# Patient Record
Sex: Female | Born: 1935 | Race: White | Hispanic: No | Marital: Married | Smoking: Never smoker
Health system: Southern US, Community
[De-identification: ages and names within clinical notes are randomized; demographics above are authoritative.]

## PROBLEM LIST (undated history)

## (undated) ENCOUNTER — Emergency Department (HOSPITAL_BASED_OUTPATIENT_CLINIC_OR_DEPARTMENT_OTHER): Payer: Medicare Other

## (undated) DIAGNOSIS — F40240 Claustrophobia: Secondary | ICD-10-CM

## (undated) DIAGNOSIS — R112 Nausea with vomiting, unspecified: Secondary | ICD-10-CM

## (undated) DIAGNOSIS — M1990 Unspecified osteoarthritis, unspecified site: Secondary | ICD-10-CM

## (undated) DIAGNOSIS — R918 Other nonspecific abnormal finding of lung field: Secondary | ICD-10-CM

## (undated) DIAGNOSIS — I499 Cardiac arrhythmia, unspecified: Secondary | ICD-10-CM

## (undated) DIAGNOSIS — N189 Chronic kidney disease, unspecified: Secondary | ICD-10-CM

## (undated) DIAGNOSIS — J8489 Other specified interstitial pulmonary diseases: Secondary | ICD-10-CM

## (undated) DIAGNOSIS — J40 Bronchitis, not specified as acute or chronic: Secondary | ICD-10-CM

## (undated) DIAGNOSIS — I10 Essential (primary) hypertension: Secondary | ICD-10-CM

## (undated) DIAGNOSIS — Z9889 Other specified postprocedural states: Secondary | ICD-10-CM

## (undated) HISTORY — DX: Other specified interstitial pulmonary diseases: J84.89

## (undated) HISTORY — PX: OTHER SURGICAL HISTORY: SHX169

## (undated) HISTORY — PX: TONSILLECTOMY: SUR1361

## (undated) HISTORY — DX: Essential (primary) hypertension: I10

## (undated) HISTORY — PX: CHOLECYSTECTOMY: SHX55

## (undated) HISTORY — PX: HIP SURGERY: SHX245

## (undated) HISTORY — PX: APPENDECTOMY: SHX54

## (undated) HISTORY — DX: Other nonspecific abnormal finding of lung field: R91.8

---

## 1998-04-11 ENCOUNTER — Emergency Department (HOSPITAL_COMMUNITY): Admission: EM | Admit: 1998-04-11 | Discharge: 1998-04-11 | Payer: Self-pay | Admitting: Emergency Medicine

## 1998-04-11 ENCOUNTER — Encounter: Payer: Self-pay | Admitting: Emergency Medicine

## 1998-10-27 ENCOUNTER — Other Ambulatory Visit: Admission: RE | Admit: 1998-10-27 | Discharge: 1998-10-27 | Payer: Self-pay | Admitting: Family Medicine

## 2000-10-30 ENCOUNTER — Other Ambulatory Visit: Admission: RE | Admit: 2000-10-30 | Discharge: 2000-10-30 | Payer: Self-pay | Admitting: Family Medicine

## 2000-11-06 ENCOUNTER — Encounter: Admission: RE | Admit: 2000-11-06 | Discharge: 2000-11-06 | Payer: Self-pay | Admitting: Family Medicine

## 2000-11-06 ENCOUNTER — Encounter: Payer: Self-pay | Admitting: Family Medicine

## 2003-10-09 ENCOUNTER — Encounter: Admission: RE | Admit: 2003-10-09 | Discharge: 2003-10-09 | Payer: Self-pay | Admitting: Specialist

## 2004-07-12 ENCOUNTER — Ambulatory Visit: Payer: Self-pay | Admitting: Critical Care Medicine

## 2004-09-13 ENCOUNTER — Ambulatory Visit: Payer: Self-pay | Admitting: Critical Care Medicine

## 2005-01-12 ENCOUNTER — Encounter: Admission: RE | Admit: 2005-01-12 | Discharge: 2005-01-12 | Payer: Self-pay

## 2005-01-18 ENCOUNTER — Ambulatory Visit: Payer: Self-pay | Admitting: Critical Care Medicine

## 2005-01-23 ENCOUNTER — Ambulatory Visit: Admission: RE | Admit: 2005-01-23 | Discharge: 2005-01-23 | Payer: Self-pay | Admitting: Critical Care Medicine

## 2005-01-23 ENCOUNTER — Ambulatory Visit: Payer: Self-pay | Admitting: Critical Care Medicine

## 2005-01-23 ENCOUNTER — Encounter (INDEPENDENT_AMBULATORY_CARE_PROVIDER_SITE_OTHER): Payer: Self-pay | Admitting: *Deleted

## 2005-02-01 ENCOUNTER — Ambulatory Visit: Payer: Self-pay | Admitting: Critical Care Medicine

## 2005-02-22 ENCOUNTER — Ambulatory Visit: Payer: Self-pay | Admitting: Critical Care Medicine

## 2005-03-06 ENCOUNTER — Ambulatory Visit: Payer: Self-pay | Admitting: Cardiology

## 2005-03-23 ENCOUNTER — Ambulatory Visit: Payer: Self-pay | Admitting: Pulmonary Disease

## 2005-04-05 ENCOUNTER — Ambulatory Visit: Payer: Self-pay | Admitting: Critical Care Medicine

## 2005-04-11 ENCOUNTER — Ambulatory Visit: Payer: Self-pay | Admitting: Cardiology

## 2005-05-01 ENCOUNTER — Ambulatory Visit: Payer: Self-pay | Admitting: Critical Care Medicine

## 2005-07-26 ENCOUNTER — Ambulatory Visit: Payer: Self-pay | Admitting: Internal Medicine

## 2005-10-22 ENCOUNTER — Ambulatory Visit: Payer: Self-pay | Admitting: Pulmonary Disease

## 2005-11-20 ENCOUNTER — Ambulatory Visit: Payer: Self-pay | Admitting: Critical Care Medicine

## 2005-11-23 ENCOUNTER — Ambulatory Visit: Payer: Self-pay | Admitting: Cardiology

## 2005-11-27 ENCOUNTER — Ambulatory Visit: Admission: RE | Admit: 2005-11-27 | Discharge: 2005-11-27 | Payer: Self-pay | Admitting: Critical Care Medicine

## 2005-12-12 ENCOUNTER — Ambulatory Visit: Payer: Self-pay | Admitting: Critical Care Medicine

## 2006-01-23 ENCOUNTER — Ambulatory Visit: Payer: Self-pay | Admitting: Critical Care Medicine

## 2006-01-25 ENCOUNTER — Ambulatory Visit: Payer: Self-pay | Admitting: Cardiology

## 2006-03-20 ENCOUNTER — Ambulatory Visit: Payer: Self-pay | Admitting: Critical Care Medicine

## 2006-05-20 ENCOUNTER — Ambulatory Visit: Payer: Self-pay | Admitting: Critical Care Medicine

## 2006-07-01 ENCOUNTER — Ambulatory Visit: Payer: Self-pay | Admitting: Critical Care Medicine

## 2006-08-08 ENCOUNTER — Ambulatory Visit: Payer: Self-pay | Admitting: Critical Care Medicine

## 2006-10-21 ENCOUNTER — Ambulatory Visit: Payer: Self-pay | Admitting: Pulmonary Disease

## 2006-11-13 ENCOUNTER — Ambulatory Visit: Payer: Self-pay | Admitting: Critical Care Medicine

## 2006-11-28 ENCOUNTER — Encounter: Admission: RE | Admit: 2006-11-28 | Discharge: 2006-11-28 | Payer: Self-pay | Admitting: Family Medicine

## 2006-12-13 ENCOUNTER — Encounter: Admission: RE | Admit: 2006-12-13 | Discharge: 2006-12-13 | Payer: Self-pay | Admitting: Gastroenterology

## 2007-01-07 DIAGNOSIS — K219 Gastro-esophageal reflux disease without esophagitis: Secondary | ICD-10-CM

## 2007-01-07 DIAGNOSIS — J8410 Pulmonary fibrosis, unspecified: Secondary | ICD-10-CM | POA: Insufficient documentation

## 2007-03-06 ENCOUNTER — Ambulatory Visit: Payer: Self-pay | Admitting: Critical Care Medicine

## 2007-03-06 DIAGNOSIS — I10 Essential (primary) hypertension: Secondary | ICD-10-CM | POA: Insufficient documentation

## 2007-04-04 ENCOUNTER — Inpatient Hospital Stay (HOSPITAL_COMMUNITY): Admission: EM | Admit: 2007-04-04 | Discharge: 2007-04-08 | Payer: Self-pay | Admitting: Emergency Medicine

## 2007-04-04 ENCOUNTER — Ambulatory Visit: Payer: Self-pay | Admitting: Physical Medicine & Rehabilitation

## 2007-04-04 ENCOUNTER — Encounter (INDEPENDENT_AMBULATORY_CARE_PROVIDER_SITE_OTHER): Payer: Self-pay | Admitting: Specialist

## 2007-04-04 ENCOUNTER — Ambulatory Visit: Payer: Self-pay | Admitting: Critical Care Medicine

## 2007-04-08 ENCOUNTER — Inpatient Hospital Stay (HOSPITAL_COMMUNITY)
Admission: RE | Admit: 2007-04-08 | Discharge: 2007-04-11 | Payer: Self-pay | Admitting: Physical Medicine & Rehabilitation

## 2008-07-22 ENCOUNTER — Ambulatory Visit: Payer: Self-pay | Admitting: Critical Care Medicine

## 2008-12-09 ENCOUNTER — Ambulatory Visit: Payer: Self-pay | Admitting: Critical Care Medicine

## 2009-03-29 ENCOUNTER — Telehealth (INDEPENDENT_AMBULATORY_CARE_PROVIDER_SITE_OTHER): Payer: Self-pay | Admitting: *Deleted

## 2009-03-31 ENCOUNTER — Ambulatory Visit: Payer: Self-pay | Admitting: Diagnostic Radiology

## 2009-03-31 ENCOUNTER — Ambulatory Visit: Payer: Self-pay | Admitting: Critical Care Medicine

## 2009-03-31 ENCOUNTER — Ambulatory Visit (HOSPITAL_BASED_OUTPATIENT_CLINIC_OR_DEPARTMENT_OTHER): Admission: RE | Admit: 2009-03-31 | Discharge: 2009-03-31 | Payer: Self-pay | Admitting: Critical Care Medicine

## 2009-04-15 ENCOUNTER — Encounter: Payer: Self-pay | Admitting: Critical Care Medicine

## 2009-04-21 ENCOUNTER — Ambulatory Visit: Payer: Self-pay | Admitting: Diagnostic Radiology

## 2009-04-21 ENCOUNTER — Ambulatory Visit: Payer: Self-pay | Admitting: Critical Care Medicine

## 2009-04-21 ENCOUNTER — Ambulatory Visit (HOSPITAL_BASED_OUTPATIENT_CLINIC_OR_DEPARTMENT_OTHER): Admission: RE | Admit: 2009-04-21 | Discharge: 2009-04-21 | Payer: Self-pay | Admitting: Family Medicine

## 2010-04-27 NOTE — Progress Notes (Signed)
Summary: PNUEMONIA  Phone Note Call from Patient Call back at [phone]   Caller: Patient Call For: WRIGHT Summary of Call: THICK CONGESTION IN THROAT COUGH NO FEVER PT STATES DR Delford Field WANT TO SEE HER IF THESE SYMPTOMS OCCUR Initial call taken by: Rickard Patience,  March 29, 2009 1:41 PM  Follow-up for Phone Call        called and spoke with pt.  pt was most recently seen by TP 12-09-2008.  Pt last saw PW 07-22-2008.  Pt states she has "residual pna in the lining of her lungs" and is coughing up white sputum and pain across shoulder blades and back x 3 to 4 days. Pt denied increased sob or fever.  Offered pt an appt with TP or another provider in the office.  Pt declined stating she only wanted to see PW.  PW's only day here  the rest of the week is Thurs and that's at [employer].  Schedule is full.  Spoke with TD regarding what to offer pt.  Per TD,  again offer an appt with any provider or she can go to urgent care or ED.  Called and spoke with pt.  Informed her again that PW has no opening but would gladly schedule her an appt with another provider in the office or suggested she can go to ER or urgent care.  Pt again declined appt with another provider and instead wanted to schedule appt with PW for next avail.  PT scheduled to see PW 04/07/2009 at 10:57m.  Pt was instructed to go to ER if she felt her symptoms needed immedate attention.  Pt verbalized understanding.  Aundra Millet Reynolds LPN  March 29, 2009 3:32 PM    ** Will still forward message to PW so he is aware.      Appended Document: PNUEMONIA I only see 12 pts on thursday [facility] schedule  pls offer that to the pt   pw  Appended Document: PNUEMONIA Patient is scheduled to see PW in the [facility] office on 03/31/09 @ 10:50pm and was given directions and the phone number.  Appended Document: PNUEMONIA good  pw

## 2010-04-27 NOTE — Miscellaneous (Signed)
Summary: Orders Update   Clinical Lists Changes  Orders: Added new Service order of Est. Patient Level V (99215) - Signed 

## 2010-04-27 NOTE — Assessment & Plan Note (Signed)
Summary: Pulmonary OV   CC:  Acute Visit with CXR.  having aching back and shoulder pain that is radiatig to neck, throat congestion with thick, clear mucus, and and dry cough x1wk.  Marland Kitchen  History of Present Illness: 77 yo female  with known history of  BOOP/COP.    July 22, 2008-  last ov 03/06/07 Last seen in hosp with jan 09  hip fx. Pt doing well since.  No new resp complaints.  No cough.  No mucous.  No chest pain.  No wheeze.    December 09, 2008--Presents for an acute office visit .Complains of  cough-dry x 1 wk.,  hoarseness and fagtigue . Denies chest pain, dyspnea, orthopnea, hemoptysis, fever, n/v/d, edema, headache. Drippy nose, sore throat. No discolored mucus.   March 31, 2009 11:34 AM The pt saw NP in sept 10. There is  some congestoin and rx abx and this cleared. now: was ok until the past week pain back and neck and shoulders.  Pain across the back. saw PCP: BP was high, rx muscle relaxant not taken notes more cough prod of clear thick and is worse,  no real fever,  some pndrip,  not as dyspneic as before.  has to concentrate on deep breathing.  Preventive Screening-Counseling & Management  Alcohol-Tobacco     Smoking Status: never  Current Medications (verified): 1)  Benicar 5 Mg Tabs (Olmesartan Medoxomil) .... Take 1 Tablet By Mouth Once A Day 2)  Ativan 0.5 Mg  Tabs (Lorazepam) .... At Bedtime 3)  Fluticasone Propionate 50 Mcg/act Susp (Fluticasone Propionate) .Marland Kitchen.. 1 Spray Each Nostril As Needed 4)  Calcium Citrate With Vitamin D .... 1/2 Tab Once Daily  Allergies (verified): 1)  ! Codeine  Past History:  Past medical, surgical, family and social histories (including risk factors) reviewed, and no changes noted (except as noted below).  Past Medical History: Reviewed history from 03/06/2007 and no changes required. 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  Social History: Reviewed  history and no changes required.  Review of Systems       The patient complains of shortness of breath with activity.  The patient denies shortness of breath at rest, productive cough, non-productive cough, coughing up blood, chest pain, irregular heartbeats, acid heartburn, indigestion, loss of appetite, weight change, abdominal pain, difficulty swallowing, sore throat, tooth/dental problems, headaches, nasal congestion/difficulty breathing through nose, sneezing, itching, ear ache, anxiety, depression, hand/feet swelling, joint stiffness or pain, rash, change in color of mucus, and fever.    Vital Signs:  Patient profile:   77 year old female Height:      64 inches Weight:      121 pounds BMI:     20.84 O2 Sat:      97 % on Room air Temp:     97.8 degrees F oral Pulse rate:   71 / minute BP sitting:   134 / 72  (left arm) Cuff size:   regular  Vitals Entered By: Gweneth Dimitri RN (March 31, 2009 11:28 AM)  O2 Flow:  Room air CC: Acute Visit with CXR.  having aching back and shoulder pain that is radiatig to neck, throat congestion with thick, clear mucus, and dry cough x1wk.   Comments Medications reviewed with patient Gweneth Dimitri RN  March 31, 2009 11:29 AM    Physical Exam  Additional Exam:  Gen: Pleasant, well nourished, in no distress ENT:  no lesions, pale mucosa Neck: No JVD, no TMG, no carotid bruits Lungs: no use of accessory muscles, no dullness to percussion, clear without rales or rhonchi Cardiovascular: RRR, heart sounds normal, no murmurs or gallops, no peripheral edema Musculoskeletal: No deformities, no cyanosis or clubbing  Skin no rash, clear.     CXR  Procedure date:  03/31/2009  Findings:      IMPRESSION:   1.  No acute cardiopulmonary process. 2.  Hyperinflation suggests emphysema. 3.  Potential new pulmonary nodule at the right lung base as described above.  Recommend noncontrast CT of the thorax.  CT of Chest  Procedure date:   03/31/2009  Findings:      IMPRESSION: The density in the right lower lung at radiography represents two adjacent nodules posteriorly in the right lower lobe.  I think these are highly likely to represent benign granulomas.  The more lateral nodule is the same size as was seen in September 2008 and shows progressive calcification.  The more medial nodule has enlarged from 3 mm to 5 mm, is well circumscribed and shows similar progressive calcification.  Given this appearance, I do not think further follow-up would be necessary.  CT of Chest  Procedure date:  03/31/2009  Findings:      Findings: There is no pleural or pericardial fluid.  There is no mediastinal or hilar mass or adenopathy.  The left lung is clear except for mild scarring at the base.  The right lung shows mild chronic scarring in the middle lobe.  There are two adjacent nodules posteriorly in the right lower lobe on image 43.  The more lateral nodule is unchanged from the previous examination in size, measuring 5 x 8 mm.  This shows progressive calcification.  Just medial to that, there is a well circumscribed nodule that previously measured 3 mm and presently measures 5 mm.  This shows similar progressive calcification.  I think it is highly likely that this constellation of findings indicates benign granulomas.   Scans in the upper abdomen do not show any worrisome finding.   IMPRESSION: The density in the right lower lung at radiography represents two adjacent nodules posteriorly in the right lower lobe.  I think these are highly likely to represent benign granulomas.  The more lateral nodule is the same size as was seen in September 2008 and shows progressive calcification.  The more medial nodule has enlarged from 3 mm to 5 mm, is well circumscribed and shows similar progressive calcification.  Given this appearance, I do not think further follow-up would be necessary.    Impression &  Recommendations:  Problem # 1:  PULMONARY FIBROSIS, POSTINFLAMMATORY (ICD-515) Assessment Deteriorated  Stable pulmonary fibrosis isolated to LLL,  RLL nodular changes consistent with granulomas on CT chest,  hx of BOOP now with mild flare  rec: pulse prednisone 40mg >>>0 over 8days avelox x 5 days  Medications Added to Medication List This Visit: 1)  Ativan 0.5 Mg Tabs (Lorazepam) .... At bedtime 2)  Fluticasone Propionate 50 Mcg/act Susp (Fluticasone propionate) .Marland Kitchen.. 1 spray each nostril as needed 3)  Calcium Citrate With Vitamin D  .... 1/2 tab once daily 4)  Prednisone 10 Mg Tabs (Prednisone) .... Take as directed 4 each am x3days, 3 x 3days, 2 x 3days, 1 x 3days then stop 5)  Avelox 400 Mg Tabs (Moxifloxacin hcl) .... By mouth daily  Complete Medication List: 1)  Benicar 5 Mg Tabs (Olmesartan medoxomil) .... Take 1 tablet by mouth once  a day 2)  Ativan 0.5 Mg Tabs (Lorazepam) .... At bedtime 3)  Fluticasone Propionate 50 Mcg/act Susp (Fluticasone propionate) .Marland Kitchen.. 1 spray each nostril as needed 4)  Calcium Citrate With Vitamin D  .... 1/2 tab once daily 5)  Prednisone 10 Mg Tabs (Prednisone) .... Take as directed 4 each am x3days, 3 x 3days, 2 x 3days, 1 x 3days then stop 6)  Avelox 400 Mg Tabs (Moxifloxacin hcl) .... By mouth daily  Other Orders: T-2 View CXR (71020TC) Radiology Referral (Radiology)  Patient Instructions: 1)  Avelox one daily x 5 days 2)  Prednisone 10mg  4 each am x3days, 3 x 3days, 2 x 3days, 1 x 3days then stop 3)  A CT chest will be obtained today , I will call with results 4)  Return two- three weeks [facility] office Prescriptions: AVELOX 400 MG  TABS (MOXIFLOXACIN HCL) By mouth daily Brand medically necessary #5 x 0   Entered and Authorized by:   Storm Frisk MD   Signed by:   Storm Frisk MD on 03/31/2009   Method used:   [facility] to        [facility]  [phone]* (retail)       [address] [place], [place]  [address]        Ph: [phone] or [phone]       Fax: [phone]   RxID:   [id number] PREDNISONE 10 MG  TABS (PREDNISONE) Take as directed 4 each am x3days, 3 x 3days, 2 x 3days, 1 x 3days then stop  #30 x 0   Entered and Authorized by:   Storm Frisk MD   Signed by:   Storm Frisk MD on 03/31/2009   Method used:   Electronically to        [facility]  [phone]* (retail)       [address] [place], [place]  [address]       Ph: [phone] or [phone]       Fax: [phone]   RxID:   [id number]

## 2010-04-27 NOTE — Assessment & Plan Note (Signed)
Summary: Pulmonary OV   CC:  3 wk follow up.  states breathing is doing well overall.  No complaints..  History of Present Illness: 77 yo female  with known history of  BOOP/COP.    July 22, 2008-  last ov 03/06/07 Last seen in hosp with jan 09  hip fx. Pt doing well since.  No new resp complaints.  No cough.  No mucous.  No chest pain.  No wheeze.    December 09, 2008--Presents for an acute office visit .Complains of  cough-dry x 1 wk.,  hoarseness and fagtigue . Denies chest pain, dyspnea, orthopnea, hemoptysis, fever, n/v/d, edema, headache. Drippy nose, sore throat. No discolored mucus.   March 31, 2009 11:34 AM The pt saw NP in sept 10. There is  some congestoin and rx abx and this cleared. now: was ok until the past week pain back and neck and shoulders.  Pain across the back. saw PCP: BP was high, rx muscle relaxant not taken notes more cough prod of clear thick and is worse,  no real fever,  some pndrip,  not as dyspneic as before.  has to concentrate on deep breathing.  April 21, 2009 10:16 AM Pain is gone and no congestion.  Not much pndrip.   Able to deep breath.  Sl cough and is minimal. No new issues.  CT Chest was ok.  Nodules in RLL are granulomas.  Preventive Screening-Counseling & Management  Alcohol-Tobacco     Smoking Status: quit > 6 months  Current Medications (verified): 1)  Benicar 5 Mg Tabs (Olmesartan Medoxomil) .... Take 1 Tablet By Mouth Once A Day 2)  Ativan 0.5 Mg  Tabs (Lorazepam) .... At Bedtime 3)  Fluticasone Propionate 50 Mcg/act Susp (Fluticasone Propionate) .Marland Kitchen.. 1 Spray Each Nostril As Needed 4)  Calcium Citrate With Vitamin D .... 1/2 Tab Once Daily 5)  Eql Childrens Multivitamins  Chew (Pediatric Multiple Vitamins) .... Once Daily  Allergies (verified): 1)  ! Codeine  Past History:  Past medical, surgical, family and social histories (including risk factors) reviewed, and no changes noted (except as noted below).  Past Medical  History: Reviewed history from 03/06/2007 and no changes required. 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  Social History: Reviewed history and no changes required. Smoking Status:  quit > 6 months  Review of Systems  The patient denies shortness of breath with activity, shortness of breath at rest, productive cough, non-productive cough, coughing up blood, chest pain, irregular heartbeats, acid heartburn, indigestion, loss of appetite, weight change, abdominal pain, difficulty swallowing, sore throat, tooth/dental problems, headaches, nasal congestion/difficulty breathing through nose, sneezing, itching, ear ache, anxiety, depression, hand/feet swelling, joint stiffness or pain, rash, change in color of mucus, and fever.    Vital Signs:  Patient profile:   77 year old female Height:      64 inches Weight:      120 pounds BMI:     20.67 O2 Sat:      97 % on Room air Temp:     97.9 degrees F oral Pulse rate:   76 / minute BP sitting:   134 / 90  (left arm) Cuff size:   regular  Vitals Entered By: Gweneth Dimitri RN (April 21, 2009 9:55 AM)  O2 Flow:  Room air CC: 3 wk follow up.  states breathing is doing well overall.  No complaints. Comments Medications reviewed with patient Daytime contact  number verified with patient. Gweneth Dimitri RN  April 21, 2009 9:56 AM    Physical Exam  Additional Exam:  Gen: Pleasant, well nourished, in no distress ENT: no lesions, pale mucosa Neck: No JVD, no TMG, no carotid bruits Lungs: no use of accessory muscles, no dullness to percussion, clear without rales or rhonchi Cardiovascular: RRR, heart sounds normal, no murmurs or gallops, no peripheral edema Musculoskeletal: No deformities, no cyanosis or clubbing  Skin no rash, clear.     Impression & Recommendations:  Problem # 1:  UPPER RESPIRATORY INFECTION (ICD-465.9) Assessment Improved URI  resolved  Orders: Est. Patient Level II (81191)  Problem # 2:  PULMONARY FIBROSIS, POSTINFLAMMATORY (ICD-515) Assessment: Improved No evidence of pulmonary fibrosis or boop recurrence on recent CT Chest Benign calcifiing granulomas seen.    plan no further CT Chest needed   Medications Added to Medication List This Visit: 1)  Eql Childrens Multivitamins Chew (Pediatric multiple vitamins) .... Once daily  Complete Medication List: 1)  Benicar 5 Mg Tabs (Olmesartan medoxomil) .... Take 1 tablet by mouth once a day 2)  Ativan 0.5 Mg Tabs (Lorazepam) .... At bedtime 3)  Fluticasone Propionate 50 Mcg/act Susp (Fluticasone propionate) .Marland Kitchen.. 1 spray each nostril as needed 4)  Calcium Citrate With Vitamin D  .... 1/2 tab once daily 5)  Eql Childrens Multivitamins Chew (Pediatric multiple vitamins) .... Once daily  Patient Instructions: 1)  no changes    2)  retrurn as needed   Immunization History:  Influenza Immunization History:    Influenza:  historical (12/24/2008)

## 2010-05-05 ENCOUNTER — Telehealth (INDEPENDENT_AMBULATORY_CARE_PROVIDER_SITE_OTHER): Payer: Self-pay | Admitting: *Deleted

## 2010-05-11 ENCOUNTER — Ambulatory Visit (HOSPITAL_BASED_OUTPATIENT_CLINIC_OR_DEPARTMENT_OTHER)
Admission: RE | Admit: 2010-05-11 | Discharge: 2010-05-11 | Disposition: A | Discharge status: Home / Self Care | Payer: Medicare Other | Source: Ambulatory Visit | Attending: Critical Care Medicine | Admitting: Critical Care Medicine

## 2010-05-11 ENCOUNTER — Encounter: Payer: Self-pay | Admitting: Critical Care Medicine

## 2010-05-11 ENCOUNTER — Other Ambulatory Visit: Payer: Self-pay | Admitting: Critical Care Medicine

## 2010-05-11 ENCOUNTER — Ambulatory Visit (INDEPENDENT_AMBULATORY_CARE_PROVIDER_SITE_OTHER): Payer: Medicare Other | Admitting: Critical Care Medicine

## 2010-05-11 DIAGNOSIS — J8410 Pulmonary fibrosis, unspecified: Secondary | ICD-10-CM

## 2010-05-11 DIAGNOSIS — R059 Cough, unspecified: Secondary | ICD-10-CM | POA: Insufficient documentation

## 2010-05-11 DIAGNOSIS — I517 Cardiomegaly: Secondary | ICD-10-CM | POA: Insufficient documentation

## 2010-05-11 DIAGNOSIS — R05 Cough: Secondary | ICD-10-CM | POA: Insufficient documentation

## 2010-05-11 DIAGNOSIS — J189 Pneumonia, unspecified organism: Secondary | ICD-10-CM

## 2010-05-11 DIAGNOSIS — J4489 Other specified chronic obstructive pulmonary disease: Secondary | ICD-10-CM | POA: Insufficient documentation

## 2010-05-11 DIAGNOSIS — I10 Essential (primary) hypertension: Secondary | ICD-10-CM

## 2010-05-11 DIAGNOSIS — J449 Chronic obstructive pulmonary disease, unspecified: Secondary | ICD-10-CM | POA: Insufficient documentation

## 2010-05-11 NOTE — Progress Notes (Signed)
Summary: Cough - abx sent to pharmacy<<<ov next week w/cxr  Phone Note Call from Patient Call back at Home Phone [phone]   Caller: Patient Summary of Call: Pt called c/o dry cough and pain across her shoulder blades.She denied any increased SOB, wheeze, fever or other c/o's.  She was requesting appt with PW.  I advised nothing available and I offered her appt with MW for this pm and she refused, stating that she will only see Dr Delford Field b/c of her "rare condition".  She states that she would like tio make first available appt with PW and so sched her to see him on 3/12.  She does want to speak with PW's nurse however, and see if she can be worked in sooner. Will forward to triage. Pls advise thanks! Initial call taken by: Vernie Murders,  May 05, 2010 11:01 AM  Follow-up for Phone Call        Pt c/o dry cough and occasional pain across her shoulder blades at times. She has refused to see any other physicians in the office today and has sch an appt w/ PW in [facility] office next Thurs., 05/11/2010.  I will forward msg to our doc of the day, Dr. Craige Cotta,  for any recs. Pt instructed to call for a sooner appt with any provider available if her sxs get worse. Pls advise.Michel Bickers [facility]  May 05, 2010 11:32 AM  Additional Follow-up for Phone Call Additional follow up Details #1::        She is refusing to be evaluated by any other physician besides Dr. Delford Field.  Not sure what else can be done except have her keep appt with Dr. Delford Field, and advise her to go to ER if her symptoms get worse before seeing Dr. Delford Field. Additional Follow-up by: Coralyn Helling MD,  May 05, 2010 11:38 AM    Additional Follow-up for Phone Call Additional follow up Details #2::    Pt is aware to go to ER if her sxs get worse before OV with Dr. Delford Field in [facility] on Thurs., 05/11/2010.  Will forward to PW so he is aware. Follow-up by: Michel Bickers CMA,  May 05, 2010 11:41 AM  Additional Follow-up for Phone  Call Additional follow up Details #3:: Details for Additional Follow-up Action Taken: noted  and I would call in for this pt avelox 400mg /d x 5days until appt she needs CXR first on day of appt   Additional Follow-up by: Storm Frisk MD,  May 05, 2010 12:56 PM  New/Updated Medications: AVELOX 400 MG TABS (MOXIFLOXACIN HCL) 1 by mouth daily x5 days Prescriptions: AVELOX 400 MG TABS (MOXIFLOXACIN HCL) 1 by mouth daily x5 days  #5 x 0   Entered by:   Michel Bickers CMA   Authorized by:   Storm Frisk MD   Signed by:   Michel Bickers CMA on 05/05/2010   Method used:   [facility]        [facility]  [phone]* (retail)       [address] [place], [place]  [address]       Ph: [phone] or [phone]       Fax: [phone]   RxID:   [id number]   LMOMTCB to inform pt of above recs per PW and see where she wants abx sent.  Gweneth Dimitri RN  May 05, 2010 1:10 PM  Returning call.Darletta Moll  May 05, 2010 2:38 PM  Pt  is aware of RX for Avelox and [facility].  She will keep OV with PW in [facility] on Thurs., 05/11/2010 and have CXR done.  Pt instructed to call if her sxs do not improve or get worse before appt date. Pt verbalized understanding.  Michel Bickers CMA  May 05, 2010 2:45 PM

## 2010-05-11 NOTE — Progress Notes (Signed)
Summary: Avelox too expensive<<<samples left for pt to pick up  Phone Note From Pharmacy Call back at [phone]   [facility]  [address]  [phone]* Call For: wright  Summary of Call: Pharmacist Robin called to ask for substitute for Avelox which is too expensive for patient. Initial call taken by: Leonette Monarch,  May 05, 2010 3:09 PM  Follow-up for Phone Call        Pharmacy aware to disregard RX sent for Avelox and we will give pt samples from the office. Pt is also aware and will pick samples up before 5:30pm today. Pt aware she will take 1 by mouth daily for 5 days as before. Follow-up by: Michel Bickers CMA,  May 05, 2010 3:34 PM

## 2010-05-17 NOTE — Miscellaneous (Signed)
Summary: Orders Update  Clinical Lists Changes  Orders: Added new Test order of T-2 View CXR (71020TC) - Signed 

## 2010-05-17 NOTE — Assessment & Plan Note (Signed)
Summary: Pulmonary OV   Primary Provider/Referring Provider:  Elmarie Shiley   CC:  Acute Visit with CXR.  c/o nonprod cough and achy pains and tightness in neck, across the shoulders, back x couple of weeks.  Denies SOB, wheezing, and chest tightness.[name]  History of Present Illness: 77 yo female  with known history of  BOOP/COP.    May 11, 2010 2:37 PM Feels bad, BP high,  no real cough.  Rx avelox for 5days and chest is looser. 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  SYmptoms come and go. No wheeze.  No dyspnea with exertion.  Notes sl discomfort in the back. no real edema in the feet.  No abx in past two years     CXR  Procedure date:  05/11/2010  Findings:      IMPRESSION: Stable changes of COPD and chronic bronchitis, borderline cardiomegaly and probable granulomata at the right lung base.  No acute abnormality.   Preventive Screening-Counseling & Management  Alcohol-Tobacco     Smoking Status: never     Passive Smoke Exposure: yes  Comments: father smoked.    Current Medications (verified): 1)  Benicar 5 Mg Tabs (Olmesartan Medoxomil) .... Take 1 Tablet By Mouth Once A Day 2)  Ativan 0.5 Mg  Tabs (Lorazepam) .... At Bedtime 3)  Fluticasone Propionate 50 Mcg/act Susp (Fluticasone Propionate) .[name].. 1 Spray Each Nostril As Needed 4)  Calcium Citrate With Vitamin D .... 1/2 Tab Once Daily 5)  Eql Childrens Multivitamins  Chew (Pediatric Multiple Vitamins) .... Once Daily  Allergies (verified): 1)  ! Codeine  Past History:  Past medical, surgical, family and social histories (including risk factors) reviewed, and no changes noted (except as noted below).  Past Medical History: Reviewed history from 03/06/2007 and no changes required. 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  Social  History: Reviewed history and no changes required. Smoking Status:  never Passive Smoke Exposure:  yes  Review of Systems       The patient complains of chest pain, difficulty swallowing, and joint stiffness or pain.  The patient denies shortness of breath with activity, shortness of breath at rest, productive cough, non-productive cough, coughing up blood, irregular heartbeats, acid heartburn, indigestion, loss of appetite, weight change, abdominal pain, sore throat, tooth/dental problems, headaches, nasal congestion/difficulty breathing through nose, sneezing, itching, ear ache, anxiety, depression, hand/feet swelling, rash, change in color of mucus, and fever.    Vital Signs:  Patient profile:   77 year old female Height:      64 inches Weight:      116 pounds BMI:     19.98 O2 Sat:      99 % on Room air Temp:     97.8 degrees F oral Pulse rate:   77 / minute BP sitting:   150 / 90  (left arm) Cuff size:   regular  Vitals Entered By: Gweneth Dimitri RN (May 11, 2010 2:10 PM)  O2 Flow:  Room air CC: Acute Visit with CXR.  c/o nonprod cough and achy pains and tightness in neck, across the shoulders, back x couple of weeks.  Denies SOB, wheezing, chest tightness. Comments Medications reviewed with patient Daytime contact number verified with patient. Gweneth Dimitri RN  May 11, 2010 2:09 PM    Physical Exam  Additional  Exam:  Gen: Pleasant, well nourished, in no distress ENT: no lesions, pale mucosa Neck: No JVD, no TMG, no carotid bruits Lungs: no use of accessory muscles, no dullness to percussion, clear without rales or rhonchi Cardiovascular: RRR, heart sounds normal, no murmurs or gallops, no peripheral edema Musculoskeletal: No deformities, no cyanosis or clubbing  Skin no rash, clear.     Impression & Recommendations:  Problem # 1:  PULMONARY FIBROSIS, POSTINFLAMMATORY (ICD-515)  No evidence of pulmonary fibrosis or boop recurrence on current cxr however,  this pt now has obstruction on pulm function plan trial spiriva  Medications Added to Medication List This Visit: 1)  Spiriva Handihaler 18 Mcg Caps (Tiotropium bromide monohydrate) .... Two puffs in handihaler daily  Complete Medication List: 1)  Benicar 5 Mg Tabs (Olmesartan medoxomil) .... Take 1 tablet by mouth once a day 2)  Ativan 0.5 Mg Tabs (Lorazepam) .... At bedtime 3)  Fluticasone Propionate 50 Mcg/act Susp (Fluticasone propionate) .[name].. 1 spray each nostril as needed 4)  Calcium Citrate With Vitamin D  .... 1/2 tab once daily 5)  Eql Childrens Multivitamins Chew (Pediatric multiple vitamins) .... Once daily 6)  Spiriva Handihaler 18 Mcg Caps (Tiotropium bromide monohydrate) .... Two puffs in handihaler daily  Other Orders: Spirometry w/Graph (94010) Est. Patient Level V (81191) HFA Instruction [phone])  Patient Instructions: 1)  Start Spiriva daily 2)  Monitor your blood pressure and report findings to your primary care physician 3)  Return [place] 2 months Prescriptions: SPIRIVA HANDIHALER 18 MCG  CAPS (TIOTROPIUM BROMIDE MONOHYDRATE) Two puffs in handihaler daily  #30 x 6   Entered and Authorized by:   Storm Frisk MD   Signed by:   Storm Frisk MD on 05/11/2010   Method used:   Electronically to        [facility]  [phone]* (retail)       [address] [place], [place]  [address]       Ph: [phone] or [phone]       Fax: [phone]   RxID:   [id number]

## 2010-06-05 ENCOUNTER — Encounter: Payer: Self-pay | Admitting: Critical Care Medicine

## 2010-06-05 ENCOUNTER — Ambulatory Visit (INDEPENDENT_AMBULATORY_CARE_PROVIDER_SITE_OTHER): Payer: Medicare Other | Admitting: Critical Care Medicine

## 2010-06-05 DIAGNOSIS — J8410 Pulmonary fibrosis, unspecified: Secondary | ICD-10-CM

## 2010-06-12 ENCOUNTER — Institutional Professional Consult (permissible substitution) (INDEPENDENT_AMBULATORY_CARE_PROVIDER_SITE_OTHER): Payer: Medicare Other | Admitting: Cardiology

## 2010-06-12 DIAGNOSIS — J984 Other disorders of lung: Secondary | ICD-10-CM

## 2010-06-12 DIAGNOSIS — I10 Essential (primary) hypertension: Secondary | ICD-10-CM

## 2010-06-13 NOTE — Assessment & Plan Note (Signed)
Summary: Pulmonary OV   Primary Provider/Referring Provider:  Elmarie Shiley   CC:  Follow up.  Pt states she is back to baseline.  Occas nonprod cough.  Denies SOB, wheezing, and chest tightness.  Marland Kitchen  History of Present Illness: 77 yo female  with known history of  BOOP/COP.    May 11, 2010 2:37 PM Feels bad, BP high,  no real cough.  Rx avelox for 5days and chest is looser. 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  SYmptoms come and go. No wheeze.  No dyspnea with exertion.  Notes sl discomfort in the back. no real edema in the feet.  No abx in past two years   June 05, 2010 3:41 PM Since last ov, whatever came in 2/12 and is left.  No real cough, no wheeze,  not dyspneic  No new issues. No excess cough or wheeze.  Overall pt is improved  Clinical Reports Reviewed:  PFT's:  05/11/2010: FEF 25/75 %Predicted:  47.381 FEV1 %Predicted:  65.830 FEV1/FVC %Predicted:  88.217 FVC %Predicted:  74.245   Preventive Screening-Counseling & Management  Alcohol-Tobacco     Smoking Status: never     Passive Smoke Exposure: yes  Current Medications (verified): 1)  Benicar 5 Mg Tabs (Olmesartan Medoxomil) .... Take 1 Tablet By Mouth Once A Day 2)  Ativan 0.5 Mg  Tabs (Lorazepam) .... At Bedtime 3)  Fluticasone Propionate 50 Mcg/act Susp (Fluticasone Propionate) .Marland Kitchen.. 1 Spray Each Nostril As Needed 4)  Calcium Citrate-Vitamin D 315-200 Mg-Unit Tabs (Calcium Citrate-Vitamin D) .... 1/2 Tablet Once Daily 5)  Spiriva Handihaler 18 Mcg  Caps (Tiotropium Bromide Monohydrate) .... Two Puffs in Handihaler Daily  Allergies (verified): 1)  ! Codeine  Past History:  Past medical, surgical, family and social histories (including risk factors) reviewed, and no changes noted (except as noted below).  Past Medical History: Reviewed history from 03/06/2007 and no changes required. 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  Social History: Reviewed history and no changes required. Never smoked  Review of Systems  The patient denies shortness of breath with activity, shortness of breath at rest, productive cough, non-productive cough, coughing up blood, chest pain, irregular heartbeats, acid heartburn, indigestion, loss of appetite, weight change, abdominal pain, difficulty swallowing, sore throat, tooth/dental problems, headaches, nasal congestion/difficulty breathing through nose, sneezing, itching, ear ache, anxiety, depression, hand/feet swelling, joint stiffness or pain, rash, change in color of mucus, and fever.    Vital Signs:  Patient profile:   77 year old female Height:      64 inches Weight:      118.38 pounds BMI:     20.39 O2 Sat:      97 % on Room air Temp:     97.4 degrees F oral Pulse rate:   77 / minute BP sitting:   150 / 80  (left arm) Cuff size:   regular  Vitals Entered By: Gweneth Dimitri RN (June 05, 2010 3:25 PM)  O2 Flow:  Room air CC: Follow up.  Pt states she is back to baseline.  Occas nonprod cough.  Denies SOB, wheezing, chest tightness.   Comments Medications reviewed with patient Daytime contact number verified with patient. Gweneth Dimitri RN  June 05, 2010 3:26 PM    Physical Exam  Additional Exam:  Gen: Pleasant,  well nourished, in no distress ENT: no lesions, pale mucosa Neck: No JVD, no TMG, no carotid bruits Lungs: no use of accessory muscles, no dullness to percussion, clear without rales or rhonchi Cardiovascular: RRR, heart sounds normal, no murmurs or gallops, no peripheral edema Musculoskeletal: No deformities, no cyanosis or clubbing  Skin no rash, clear.     Impression & Recommendations:  Problem # 1:  PULMONARY FIBROSIS, POSTINFLAMMATORY (ICD-515) Assessment Improved  No evidence of pulmonary fibrosis or boop recurrence on current cxr however,  this pt now has obstruction on pulm function better on spiriva  plan cont  spiriva x one more month then d/c  Medications Added to Medication List This Visit: 1)  Calcium Citrate-vitamin D 315-200 Mg-unit Tabs (Calcium citrate-vitamin d) .... 1/2 tablet once daily  Complete Medication List: 1)  Benicar 5 Mg Tabs (Olmesartan medoxomil) .... Take 1 tablet by mouth once a day 2)  Ativan 0.5 Mg Tabs (Lorazepam) .... At bedtime 3)  Fluticasone Propionate 50 Mcg/act Susp (Fluticasone propionate) .Marland Kitchen.. 1 spray each nostril as needed 4)  Calcium Citrate-vitamin D 315-200 Mg-unit Tabs (Calcium citrate-vitamin d) .... 1/2 tablet once daily 5)  Spiriva Handihaler 18 Mcg Caps (Tiotropium bromide monohydrate) .... Two puffs in handihaler daily  Other Orders: Spirometry w/Graph (94010) Est. Patient Level III (95621)  Patient Instructions: 1)  Pulmonary functions are normal now 2)  I would stay on spiriva one more month then stop and see how you are doing 3)  Return 3 months   Appended Document: Pulmonary OV Elmarie Shiley

## 2010-07-03 ENCOUNTER — Other Ambulatory Visit (HOSPITAL_COMMUNITY): Payer: Self-pay | Admitting: Cardiology

## 2010-07-03 ENCOUNTER — Ambulatory Visit (HOSPITAL_BASED_OUTPATIENT_CLINIC_OR_DEPARTMENT_OTHER): Payer: Medicare Other | Admitting: Radiology

## 2010-07-03 ENCOUNTER — Ambulatory Visit (HOSPITAL_COMMUNITY): Payer: Medicare Other | Attending: Cardiology | Admitting: Radiology

## 2010-07-03 ENCOUNTER — Other Ambulatory Visit (HOSPITAL_COMMUNITY): Payer: Medicare Other | Admitting: Radiology

## 2010-07-03 DIAGNOSIS — R0789 Other chest pain: Secondary | ICD-10-CM

## 2010-07-03 DIAGNOSIS — R5383 Other fatigue: Secondary | ICD-10-CM

## 2010-07-03 DIAGNOSIS — R5381 Other malaise: Secondary | ICD-10-CM | POA: Insufficient documentation

## 2010-07-03 DIAGNOSIS — I10 Essential (primary) hypertension: Secondary | ICD-10-CM | POA: Insufficient documentation

## 2010-07-11 ENCOUNTER — Telehealth: Payer: Self-pay | Admitting: *Deleted

## 2010-07-11 NOTE — Telephone Encounter (Signed)
Message copied by Murrell Redden on Tue Jul 11, 2010  3:57 PM ------      Message from: [place], PETER      Created: Sat Jul 08, 2010  2:37 PM       Normal Stress Echo. Please report.

## 2010-07-11 NOTE — Telephone Encounter (Signed)
Notified of stress report. Sent to Dr. Clyde Canterbury.

## 2010-08-08 NOTE — H&P (Signed)
[name], [name]          ACCOUNT NO.:  [id number]   MEDICAL RECORD NO.:  [id number]          PATIENT TYPE:  IPS   LOCATION:  4009                         FACILITY:  [facility]   PHYSICIAN:  Ranelle Oyster, M.D.DATE OF BIRTH:  [birth year]   DATE OF ADMISSION:  04/08/2007  DATE OF DISCHARGE:                              HISTORY & PHYSICAL   CHIEF COMPLAINT:  Left hip pain.   HISTORY OF PRESENT ILLNESS:  This is a pleasant 77 year old white female  with hypertension and history of BOOP/pulmonary fibrosis who fell on  January 9 sustaining a left femoral neck fracture.  She was cleared for  surgery.  Underwent a  left hip hemiarthroplasty same day by Dr. Shelle Iron.  She is partial weightbearing on the leg and was placed on Coumadin for  DVT prophylaxis.  The patient required 2 units of packed red blood cells  for postoperative anemia.  She has had some dyspnea due to her pulmonary  history.  Pain has been an issue as well at times.  The patient needs to  reach modified-independent level goals and, thus, was brought to the  inpatient rehab unit today.   REVIEW OF SYSTEMS:  Notable for wound issues, some insomnia, anxiety,  constipation, weakness.  The patient did have a bowel movement  yesterday.  Full review is in the written H&P.   PAST MEDICAL HISTORY:  Positive for hypertension.  BOOP.  Gastroesophageal reflux disease.  Appendectomy.  Cholecystectomy.  Left  shoulder with rotator cuff tendinitis.  History of panic attacks in the  distant past.  History of claustrophobia.   FAMILY HISTORY:  Is noncontributory.   SOCIAL HISTORY:  The patient is married and works as a Lawyer.  She does not smoke and occasionally drinks.  Husband works and  the patient does have assistance with some of the housework any errands  once a week.  They have a two-level house with one step to enter.  She  will stay on the first floor upon discharge home.   FUNCTIONAL HISTORY:  The patient  was independent prior to arrival.  Currently she is requiring min assist to mod assist for basic mobility  and self-care.   ALLERGIES:  CODEINE.   MEDICATIONS AT HOME:  Benicar, Os-Cal, Centrum Silver, and Ativan  nightly.   LABS:  Hemoglobin 10.8, white count 6.4, platelets 254,000.  Sodium 136,  potassium 3.9, BUN and creatinine 7 and 0.64.   PHYSICAL EXAM:  Blood pressure is 126/76, pulse is 83, respiratory rate  20, temperature 97.5.  Patient is pleasant, alert and oriented x3.  PUPILS:  Equal, round, reactive to light and accommodation.  EAR, NOSE, THROAT EXAM:  Notable for intact dentition and pink, moist  oral mucosa.  NECK:  Supple without JVD or lymphadenopathy.  CHEST:  Is clear to auscultation bilaterally except for a few rhonchi  and perhaps some rales at the right base.  HEART:  Regular rate and rhythm without murmur, rubs or gallops.  ABDOMEN:  Soft, nontender.  Bowel sounds are positive.  EXTREMITIES:  Showed no clubbing, cyanosis, and only trace edema on the  left  side.  SKIN:  Was intact with well-approximated wound over the left hip.  Leg  was appropriately tender.  NEUROLOGICALLY:  Cranial nerves II-XII are  intact.  Reflexes are 2+.  Sensation is 2/2.  Strength is 5/5 in the  upper extremities.  She is 3+ to 4/5 right lower extremity, proximal and  distal.  Left lower extremity, she is 1+ to 2/5 proximal and 3/5  distally.   ASSESSMENT/PLAN:  1. Functional deficits secondary to left hip hemiarthroplasty after      displaced femoral neck fracture.  Begin comprehensive inpatient      rehab with Physical Therapy to assess for range of motion,      strengthening, and gait.  Occupational Therapy will assess for      range of motion, strengthening, self-care, and equipment.  Rehab      nurse will follow on a 24-hour basis for bowel, bladder, skin      medications, safety issues.  Rehab case manager/social worker will      assess for psychosocial needs and  discharge planning.  Estimated      length of stay is 5-7 days.  Prognosis good.  Goals:  Modified      independent.  2. Deep vein thrombosis prophylaxis with Coumadin per pharmacy.  Add      Lovenox for INR coverage until it is greater than 2..  3. Hypertension:  Will monitor for now on Avapro as blood pressure      seems to be fairly well controlled.  4. Acute blood loss anemia:  Add iron supplement daily after 2 units      of packed red blood cell transfusion.  5. Constipation:  Begin Senokot-S at bedtime.  6. Anorexia:  Add dietary supplements.  7. Respiratory:  Continue Flonase as well as incentive spirometry.  8. Anxiety:  Continue Ativan nightly and p.r.n.      Ranelle Oyster, M.D.  Electronically Signed     ZTS/MEDQ  D:  04/08/2007  T:  04/08/2007  Job:  [id number]

## 2010-08-08 NOTE — Consult Note (Signed)
[name], [name]          ACCOUNT NO.:  [id number]   MEDICAL RECORD NO.:  [id number]          PATIENT TYPE:  EMS   LOCATION:  ED                           [facility]ACILITY[facility]  [facility]   PHYSICIAN:  Charlcie Cradle. Delford Field, MD, FCCPDATE OF BIRTH:  [birth year]   DATE OF CONSULTATION:  04/04/2007  DATE OF DISCHARGE:                                 CONSULTATION   This is a 77 year old female who has a history of bronchiolitis  obliterans organized pneumonia.  Overall, the patient has been stable in  this regard and infiltrates had cleared since original diagnosis in  November 2006.  She also has reflux disease with cyclic cough and  hypertension.  This patient was last seen our office March 06, 2007,  and was stable.  She is off systemic steroids and only maintained on  Benicar for hypertension, Os-Cal, Centrum Silver and p.r.n. Ativan.  She  fell coming down her stairs today and now has a fracture in her hip  area.  The patient is now referred for preoperative clearance for hip  fracture surgery.  The left hip is what is fractured.   PAST MEDICAL HISTORY:  Bronchiolitis obliterans organized pneumonia,  onset date of this was November 2006.  She has reflux disease and  hypertension.   CURRENT MEDICATIONS:  1. Benicar 20 mg daily.  2. Os-Cal daily.  3. Centrum Silver daily p.r.n.   ALLERGIES:  CODEINE.   FAMILY HISTORY/SOCIAL HISTORY/REVIEW OF SYSTEMS:  Noncontributory.  She  is a lifelong never smoker.   PHYSICAL EXAMINATION:  VITAL SIGNS:  Temperature 97, blood pressure  136/67, pulse 91, respirations 16, saturation 97% room air.  CHEST:  Showed to be clear without evidence of wheeze, rale or rhonchi.  CARDIAC:  Exam showed a regular rate and rhythm without S3, normal S1,  S2.  ABDOMEN:  Soft, nontender.  Extremity showed no edema, clubbing or  venous disease.  SKIN:  Clear.  NEUROLOGIC:  Exam intact.  HEENT:  Exam showed no jugular distention, lymphadenopathy.  Oropharynx   clear.  NECK:  Supple.   LABORATORY DATA:  All labs are pending.  Currently at the time of this  dictation, the ECG shows normal sinus rhythm and no acute process.  Chest x-ray was reviewed and showed no active process.   IMPRESSION:  Bronchiolitis obliterans organized pneumonia, stable at  this time.  No active disease process in the lungs at this time and left  hip fracture, traumatic   RECOMMENDATIONS:  The patient is cleared from a pulmonary standpoint for  planned surgical intervention.  May have general anesthesia.  Pulmonary  will follow-up postop.      Charlcie Cradle Delford Field, MD, [facility]  Electronically Signed     PEW/MEDQ  D:  04/04/2007  T:  04/04/2007  Job:  [id number]

## 2010-08-08 NOTE — Op Note (Signed)
NAMESHARIE, [name]          ACCOUNT NO.:  [id number]   MEDICAL RECORD NO.:  [id number]          PATIENT TYPE:  INP   LOCATION:  1620                         [facility]ACILITY:  [facility]   PHYSICIAN:  Jene Every, M.D.    DATE OF BIRTH:  17-Dec-1930   DATE OF PROCEDURE:  04/04/2007  DATE OF DISCHARGE:                               OPERATIVE REPORT   PREOPERATIVE DIAGNOSIS:  Left femoral neck fracture.   POSTOPERATIVE DIAGNOSIS:  Left femoral neck fracture.   PROCEDURE PERFORMED:  Left hip hemiarthroplasty.   ANESTHESIA:  General.   ASSISTANT:  Strader.   COMPONENTS UTILIZED:  DePuy Prodigy 13.5 stem, +0 neck, 46 diameter  acetabular component.   BRIEF HISTORY AND INDICATIONS:  The patient is a 77 year old female who  fell this morning downstairs sustaining a femoral neck fracture and was  kindly referred for definitive operation, found to have a displaced  femoral neck fracture, shortened extremely.  She was indicated for  hemiarthroplasty.  The risks and benefits discussed including bleeding,  infection, damage to vascular structures, dislocation, need for  revision, DVT, PE, anesthetic complications, etc.   TECHNIQUE:  The patient in supine position.  After the induction of  adequate general anesthesia and 1 gram of Kefzol, she was placed in the  right lateral decubitus position.  All bony prominences were well-  padded.  Well leg was gently flexed; the hip holders were utilized.  The  left peritrochanteric region and the left leg was prepped and draped in  the usual sterile fashion.  An incision was based upon the greater  trochanter for a standard posterolateral approach.  Subcutaneous tissue  was dissected.  Electrocautery was utilized to achieve hemostasis.  The  fascia lata was identified and divided in line with the skin incision.  Self-retaining retractors placed.  An adductor tenotomy was then  performed.  External rotators were identified.  Piriformis tag detached,  gently reflected posteriorly to protect the sciatic nerve throughout the  case.  It was palpated.  There was significant disruption of the capsule  and displacement posteriorly.  The capsular leaflets were tagged.  The  hip was dislocated, oscillating saw utilized to perform an osteotomy of  the femoral neck 1 fingerbreadth above the lesser trochanter.  This was  after an abductor tenotomy was then performed.  We used an initiator to  enter the femoral canal and a box cutter.  We then sequentially reamed  to a 13 mm diameter with good cortical purchase.  We palpated the  intramedullary canal.  There was no breach of the cortex.  We then  removed the femoral head, measured it to a 26.  Trialed the 26 in the  acetabulum and was found to fit satisfactorily.  We debrided the  acetabulum; cartilage was intact.  Cautery was utilized to achieve  hemostasis.  We used a DePuy femoral Prodigy, 13.5 mm, impacted it in  the appropriate version with excellent purchase, flush to the osteotomy  cut.  We then trialed a +0 head and bipolar assembly, reduced with some  difficulty, though the capsule was noted to be inverted into the  acetabulum.  This was subsequently partially excised.  We then reduced  it without difficulty.  Good range of motion without dislocation and  good leg lengths were noted.  This was then redislocated, and the  permanent bipolar assembly was impacted on the trunnion after cleaning  of the trunnion.  Head was impacted, hip was reduced and found to be  stable throughout a full range.  Good leg lengths were noted.  Next, the  wound was copiously irrigated.  Inspection showed no evidence of active  bleeding.  We repaired the fascia with #1 Ethibond interrupted figure-of-  eight sutures, the adductor tenotomy repaired with #1 Vicryl interrupted  figure-of-eight sutures, fascia lata with #1 Vicryl interrupted figure-  of-eight sutures, subcutaneous tissue 2-0 Vicryl simple sutures.   The  skin was reapproximated with staples, the wound dressed sterilely,  placed supine on the hospital bed.  Leg lengths were equivalent, good  pulses, extubated without difficulty after an immobilizer was placed and  transported to recovery in satisfactory condition.   The patient tolerated the procedure well, no known complications.  Approximate blood loss was 200 mL.      Jene Every, M.D.  Electronically Signed     JB/MEDQ  D:  04/04/2007  T:  04/05/2007  Job:  [id number]

## 2010-08-08 NOTE — Assessment & Plan Note (Signed)
[facility]                             PULMONARY OFFICE NOTE   MALESHA, SULIMAN                 MRN:          [id number]  DATE:08/08/2006                            DOB:          September 20, 1930    Ms. Delduca returns today in followup. She is a 77 year old white  female with history of bronchiolitis obliterans organized pneumonia. She  has just finished her prednisone taper and is doing well with no  respiratory complaints. Recent chest x-ray showed clearance of  infiltrate and right lower lobe.   PHYSICAL EXAMINATION:  Temperature 97.0, blood pressure 140/80, pulse  73. Saturation was 98% room air.  CHEST: Showed to be clear without evidence of wheeze, rhonchi or rales.  CARDIAC: Showed a regular rate and rhythm without S3. Normal S1, S2.  ABDOMEN: Soft, nontender.  EXTREMITIES: Showed no edema or clubbing.  SKIN: Was clear.   IMPRESSION:  Is that of bronchiolitis obliterans organized pneumonia  with right lower lobe infiltrate.   PLAN:  Is to maintain off of systemic steroids and return in one month  for followup.     Charlcie Cradle Delford Field, MD, [facility]  Electronically Signed    PEW/MedQ  DD: 08/08/2006  DT: 08/08/2006  Job #: [id number]   cc:   Otilio Connors. Gerri Spore, M.D.

## 2010-08-08 NOTE — Discharge Summary (Signed)
NAMEAILYNN, GOW          ACCOUNT NO.:  [id number]   MEDICAL RECORD NO.:  [id number]          PATIENT TYPE:  IPS   LOCATION:  4009                         FACILITY:  [facility]   PHYSICIAN:  Ranelle Oyster, M.D.DATE OF BIRTH:  1930/08/06   DATE OF ADMISSION:  04/08/2007  DATE OF DISCHARGE:  04/11/2007                               DISCHARGE SUMMARY   DISCHARGE DIAGNOSES:  1. Left displaced femoral fracture requiring hemiarthroplasty.  2. Hypertension.  3. Acute blood loss anemia.  4. Elevated anxiety levels with situational depression.   HISTORY OF PRESENT ILLNESS:  [name] is a 77 year old female with  history of hypertension and BOOP who fell April 04, 2007, sustaining a  displaced left femoral fracture.  She was cleared for surgery by Dr.  Delford Field and underwent left hip hemiarthroplasty the same day by Dr.  Shelle Iron.  Postoperatively, is partial weightbearing and on Coumadin for  DVT prophylaxis.  The patient was noted to have acute blood-loss anemia  past surgery, required 2 units packed red blood cells.  Therapies  initiated and the patient has been progressing along, continues with  impairments in mobility and self-care and rehab consulted for further  therapies.   PAST MEDICAL HISTORY:  Significant for:  1. Hypertension.  2. BOOP diagnosed in 2006.  3. GERD.  4. Appendectomy.  5. Cholecystectomy.  6. History of rotator cuff problems left shoulder.  7. History of panic attacks in distant past.  8. History of claustrophobia.  9. Insomnia.   ALLERGIES:  CODEINE.   FAMILY HISTORY:  Noncontributory.   SOCIAL HISTORY:  The patient is married, works as a Lawyer.  She lives in two-level home with a few steps at entry.  Plans for  staying on first level past discharge.  She uses alcohol occasionally.  Does not use any tobacco.   HOSPITAL COURSE:  [name] was admitted to rehab on April 08, 2007, for inpatient therapies to consist of PT/OT  daily.  Past admission  she was maintained on partial weightbearing status left lower extremity.  Her wound was monitored along.  Staples remain intact.  This is noted be  healing well without any signs or symptoms of infection, no drainage, no  erythema.  The patient was maintained on Coumadin for DVT prophylaxis  throughout her stay.  INR at time of discharge is 1.7.  The patient is  discharged on 3 mg Coumadin a day with Coumadin to continue through  May 05, 2007, for DVT prophylaxis.  The patient's blood pressures  were monitored on b.i.d. basis.  These have ranged from 110s to 130s  systolic, 60s to 16X diastolic.  Heart rate is stable in 80s range  overall.  The patient has been continent of bowel and bladder.  She has  had issues with constipation, was started on Senna-S two p.o. q.h.s.  She has refused laxatives the last couple of days and reports she will  take a laxative once discharged to home.   The patient was noted to have high levels of anxiety past admission with  bouts of lability and she was started on Lexapro 5  mg per day for mood  stabilization.  Pain management has been adequate with the use of p.r.n.  Vicodin.  Dr. Leonides Cave, neuropsychiatry, also evaluated the patient on  January 16.  The patient was noted to be alert and pleasant in no acute  distress.  No further services planned currently.  She is to follow up  with long-term PCP regarding mood issues as needed.  During her stay in  rehab Mrs. Ganesh has progressed along to being able to perform self-  care at modified-independent level using DME.  She is able to maintain  her hip precautions and partial weightbearing status without difficulty.  She is currently modified independent for transfers, modified  independent for ambulating household distances without difficulty.  She  will continue to receive further followup home health PT/OT and aide as  well as RN by [facility].  Home health RN  to draw next  pro time January 19 with [facility] to manage Coumadin through  February 9.  On April 11, 2007, the patient is discharged to home.   DISCHARGE MEDICATIONS:  1. Coumadin 2 mg one-and-a-half p.o. q.p.m.  2. Vitamin C 500 mg a day.  3. Lexapro 5 mg a day.  4. Oscal + D b.i.d.  5. Multivitamin once per day.  6. Senokot-S two p.o. q.h.s.  7. Vicodin 5/500 one to two q.8-12h. p.r.n. pain, #45 prescribed.  8. Ativan 0.5 mg one p.o. q.h.s.  9. Senokot 20 mg p.o. per day.  10.Tylenol or Vicodin to be used as needed for pain.  11.No aspirin products.   DIET:  Regular.   WOUND CARE:  Wash with antibacterial soap and water, keep area clean and  dry.   ACTIVITY LEVEL:  Intermittent supervision.  No alcohol, no smoking, no  driving.  Partial weightbearing left lower extremity.  Follow left total  hip precautions.  [facility] to provide PT, OT, aide and RN.   FOLLOW UP:  The patient to follow up with Dr. Riley Kill as needed.  Follow  up with Dr. Shelle Iron for postoperative check in 2 weeks.  Follow up with  Dr. Gerri Spore for routine check in a couple of weeks.      Greg Cutter, P.A.      Ranelle Oyster, M.D.  Electronically Signed    PP/MEDQ  D:  04/11/2007  T:  04/12/2007  Job:  [id number]   cc:   Otilio Connors. Gerri Spore, M.D.  Jene Every, M.D.

## 2010-08-08 NOTE — Assessment & Plan Note (Signed)
[facility]                             PULMONARY OFFICE NOTE   [name], [name]                 MRN:          [id number]  DATE:10/21/2006                            DOB:          13-Mar-1931    HISTORY OF PRESENT ILLNESS:  Patient is a 77 year old white female  patient of Dr. Lynelle Doctor, who has a known history of bronchiolitis  obliterans organizing a pneumonia and presents today for an acute office  visit.  Patient complains, over the last week, she has had increased  cough, congestion and thick mucus with intermittent wheezing.  Patient  has recently returned from vacation from the beach.  She reports over  the last couple of weeks, she has also been having some joint aches and  chills.  Patient denies any hemoptysis, orthopnea, PND or leg swelling.   PAST MEDICAL HISTORY:  Reviewed.   CURRENT MEDICATIONS:  Reviewed.   PHYSICAL EXAMINATION:  VITAL SIGNS:  She is afebrile with stable vital  signs.  O2 saturation is 95% on room air.  GENERAL APPEARANCE:  Patient is a pleasant, thin female in no acute  distress.  HEENT:  Unremarkable.  NECK:  Supple without cervical adenopathy.  No JVD.  LUNGS:  Lung sounds are clear without any wheezing or crackles.  CARDIOVASCULAR:  Regular rate and rhythm.  ABDOMEN:  Soft and nontender.  EXTREMITIES:  Warm without any edema.   IMPRESSION AND PLAN:  Acute tracheobronchitis.  Patient is to begin  Avelox x5 days.  Short prednisone taper over the next week.  I added  Mucinex DM twice a day.  A Xopenex nebulizer treatment was given in the  office.  Patient will return back with Dr. Delford Field in two weeks or sooner  if needed.  Patient is advised if symptoms do not improve or worsens, to  call the office for sooner follow-up.      Rubye Oaks, NP  Electronically Signed      Charlcie Cradle Delford Field, MD, [facility]  Electronically Signed   TP/MedQ  DD: 10/21/2006  DT: 10/22/2006  Job #: [id number]

## 2010-08-08 NOTE — Assessment & Plan Note (Signed)
 [facility]                             PULMONARY OFFICE NOTE   [name], [name]                 MRN:          [id number]  DATE:11/13/2006                            DOB:          12/08/1930    This is a 77 year old white female, history of previous bronchiolitis  obliterans organized pneumonia, cyclic cough, reflux disease.  Recently  had acute bronchitic spell for which she was treated with a 5 day  prednisone pack and Avelox.  She states sine that time the cough is  resolving.  She just feels weak.  She is not having chest pain, fever,  chills or sweats.  No significant dyspnea is noted.  No chest pain is  noted.  She maintains Benicar, multivitamin with calcium and vitamin D  only.  She is off systemic steroids.   EXAMINATION:  Temperature 97.9, blood pressure 130/80, pulse 76,  saturation 97% room air.  CHEST:  Showed to be clear without evidence of wheeze, rale or rhonchi.  CARDIAC:  Showed a regular rate and rhythm without S3, normal S1-S2.  ABDOMEN:  Soft, nontender.  EXTREMITIES:  Showed no edema or clubbing.  SKIN:  Clear.   IMPRESSION:  That of resolved bronchiolitis obliterans organized  pneumonia, no evidence of progression in the patient's pulmonary  fibrosis.  Recent acute bronchitis now resolved.   PLAN:  For the patient to maintain off systemic steroids at this time.  We will see the patient back in 4 months for re-check.     Charlcie Cradle Delford Field, MD, [facility]  Electronically Signed    PEW/MedQ  DD: 11/14/2006  DT: 11/15/2006  Job #: [id number]   cc:   Otilio Connors. Gerri Spore, M.D.

## 2010-08-11 NOTE — Assessment & Plan Note (Signed)
[facility]                             PULMONARY OFFICE NOTE   [name], [name]                 MRN:          [id number]  DATE:07/01/2006                            DOB:          [birth year]    [name] is a 76 year old, white female with a history of reflex  disease, cyclic cough and previous bronchiolitis obliterans organized  pneumonia right lower lobe. She noted recurrence of her cough 2 weeks  ago with aching all over, coughing up thick brown mucous. We prescribed  Avelox over the phone at 400 mg a day for 5 days. She also began  prednisone at 40 mg a day and has been on this for 7 days. The cough is  now resolving. She does maintain Benicar 20 mg daily.   PHYSICAL EXAMINATION:  VITAL SIGNS:  Temperature 97, blood pressure  118/78, pulse 79, saturation was 99% on room air.  CHEST:  Showed distant breath sounds, a few dry rales.  CARDIAC:  Showed a regular rate and rhythm without S3, normal S1, S2.  ABDOMEN:  Soft, nontender.  EXTREMITIES:  Showed no edema or clubbing.  SKIN:  Clear.  NEUROLOGIC:  Intact.  HEENT:  Showed no jugular venous distention, no lymphadenopathy.  Oropharynx clear.  NECK:  Supple.   A chest x-ray was obtained today and showed no active infiltrate in the  right lower lobe.   IMPRESSION:  Acute bronchitis with early bronchiolitis obliterans  pneumonia flare.   PLAN:  The patient is to taper prednisone slowly over the next several  weeks. A plan of taper was given. No further antibiotics are indicated.  Will see the patient back in followup in 1 month.     Charlcie Cradle Delford Field, MD, [facility]  Electronically Signed    PEW/MedQ  DD: 07/02/2006  DT: 07/02/2006  Job #: [id number]   cc:   Otilio Connors. Gerri Spore, M.D.

## 2010-08-11 NOTE — Assessment & Plan Note (Signed)
[facility]                               PULMONARY OFFICE NOTE   [name], [name]                 MRN:          [id number]  DATE:11/20/2005                            DOB:          [birth year]    [name] is a 77 year old white female with a history of bronchiolitis  obliterans organizing pneumonia diagnosed in November 2006.  She has been  off systemic steroids since January 2007.  I last saw this patient in  February 2007.  In the interim, she was seen once by the nurse practitioner  in May 2007, doing well without any complaints.  Then in July, she began  having increased shortness of breath and cough after a trip to the beach.  She was seen by Dr. Marcelyn Bruins, my partner, who felt that most of the  cough was upper airway in origin.  He prescribed the BroveX.  She was  concerned it was more than this.  She saw her primary care Prudencio Velazco, who  prescribed Levaquin the first week of August for 10 days.  The cough and  mucus production have improved somewhat and she is not having current  shortness of breath.  She is due now for a CT scan of the chest in followup.   Maintains now the calcium D daily, Benicar 40 mg daily.   PHYSICAL EXAMINATION:  VITAL SIGNS:  Temp 97, blood pressure 110/80, pulse  77, saturation was 99% on room air.  CHEST:  Showed to be clear bilateral without evidence of wheeze or rhonchi.  CARDIAC:  Showed a regular rate and rhythm without S3.  Normal S1, S2.  ABDOMEN:  Soft nontender.  EXTREMITIES:  Showed no edema or clubbing.  SKIN:  Clear.   IMPRESSION:  Bronchiolitis obliterans organized pneumonia with improved air  flow function.   The patient currently is in an improved status but at this point will need  repeat pulmonary function testing and CT scanning of the chest.  We will  obtain this and once the results of this are available, further  recommendations will follow.      Charlcie Cradle Delford Field, MD, FCCP   PEW/MedQ  DD:  11/20/2005  DT:  11/21/2005  Job #:  [id number]   cc:   Otilio Connors. Gerri Spore, MD

## 2010-08-11 NOTE — Assessment & Plan Note (Signed)
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  She is now on prednisone therapy and  tapered down to 2.5 mg every-other day, has had no recurrence of symptom  complexes.  Recent CT scan of the chest showed complete resolution of  infiltrates in the lower lobes.   EXAMINATION:  VITAL SIGNS:  Temperature was 98, blood pressure 108/74,  pulse 74, saturation 99% in room air.  CHEST:  Showed to be clear without evidence of wheeze, rale or rhonchi.  CARDIAC:  Showed a regular rate and rhythm without S3, normal S1, S2.  ABDOMEN:  Soft, nontender.  EXTREMITIES:  Showed no edema or clubbing.  SKIN:  Clear.  NEUROLOGIC:  Exam was intact.  HEENT:  Showed no jugular venous distention or lymphadenopathy,  oropharynx clear, neck supple.   IMPRESSION:  That of stable to improved bronchiolitis obliterans  organized pneumonia with resolving interstitial fibrosis.   PLAN:  The patient taper prednisone to off, maintain Benicar 20 mg  daily, and will see the patient back in return followup in 2 months.     Charlcie Cradle Delford Field, MD, [facility]  Electronically Signed    PEW/MedQ  DD: 05/20/2006  DT: 05/20/2006  Job #: [id number]   cc:   Otilio Connors. Gerri Spore, M.D.

## 2010-08-11 NOTE — Assessment & Plan Note (Signed)
[facility]                               PULMONARY OFFICE NOTE   KHRISTI, SCHILLER                 MRN:          [id number]  DATE:01/23/2006                            DOB:          June 04, 1930    [name] is a 77 year old white female, history of bronchiolitis  obliterans organized pneumonia with recurrence in the right lower lobe.  She  has been on pulsed prednisone for the last 2 months, now tapering.  Overall  she is improved with decreased cough, decreased chest congestion,  maintaining prednisone 5 mg every other day.   PHYSICAL EXAMINATION:  Temp 97.8, blood pressure 120/74, pulse 75,  saturation 98% on room air.  CHEST:  Showed to be clear with decreased wheeze or rhonchi.  CARDIAC:  Exam showed a regular rate and rhythm without S3, normal S1, S2.  ABDOMEN:  Soft, nontender.  EXTREMITIES:  Showed no edema or clubbing or venous disease.  SKIN:  Clear.   IMPRESSION:  Improved bronchiolitis obliterans organized pneumonia.   PLAN:  For the patient is to reduce prednisone down to 5 mg every other day,  and we will obtain a CT scan of the chest to evaluate interval change in the  bronchiolitis obliterans organized pneumonia.     Charlcie Cradle Delford Field, MD, [facility]  Electronically Signed    PEW/MedQ  DD: 01/24/2006  DT: 01/24/2006  Job #: [id number]

## 2010-08-11 NOTE — Assessment & Plan Note (Signed)
[facility]                             PULMONARY OFFICE NOTE   CLARANN, HELVEY                 MRN:          [id number]  DATE:03/20/2006                            DOB:          05-01-1930    Ms. Bolotin is a 77 year old white female, history of bronchiolitis  obliterans organized pneumonia right lower lobe with recent flare.  She  has tapered her prednisone down to 5 mg every other day, and has been at  this dose since end of October.  She has no respiratory complaints at  this time.   She maintains Benicar at 20 mg daily, vitamin daily, calcium D  supplement daily.   EXAMINATION:  Temperature 97.8.  Blood pressure 116/74.  Pulse 78.  Saturation 98% room air.  CHEST:  Was completely clear without evidence of wheeze, rale or  rhonchi.  CARDIAC EXAM:  Showed a regular rate and rhythm without S3.  Normal S1  and S2.  ABDOMEN:  Soft and non-tender.  EXTREMITIES:  Showed no edema or clubbing.  SKIN:  Was clear.  NEUROLOGIC EXAM:  Was intact.  HEENT EXAM:  Showed no jugular venous distention.  No lymphadenopathy.  Oropharynx clear.  NECK:  Supple.   IMPRESSION:  Resolved bronchiolitis obliterans organized pneumonia.  Note is made of recent computed tomography scan showing clearance of  infiltrate.   PLAN:  Taper prednisone to 2.5 mg every other day for 2 weeks, then  discontinue.  We will see the patient back in return in 2 months.     Charlcie Cradle Delford Field, MD, [facility]  Electronically Signed    PEW/MedQ  DD: 03/20/2006  DT: 03/20/2006  Job #: [id number]

## 2010-08-11 NOTE — Assessment & Plan Note (Signed)
[facility]                               PULMONARY OFFICE NOTE   EULALIA, ELLERMAN                 MRN:          [id number]  DATE:12/12/2005                            DOB:          02/25/1931    Ms. Rutkowski returns today in followup.  She is a 77 year old white female  with recurrent bronchiolitis obliterans organized pneumonia, right lower  lobe, superior segment.  She was placed back on prednisone therapy because  of her current infiltrates at the end of August.  She is now finishing a  course of prednisone, now down to 20 mg daily of prednisone.  Her cough and  dyspnea have markedly improved.   PHYSICAL EXAMINATION:  VITAL SIGNS:  Temperature 97, blood pressure 102/76,  pulse 89, saturation 96% on room air.  CHEST:  Clear with no evidence of adventitious breath sounds, no wheeze or  rhonchi noted.  CARDIAC:  A regular rate and rhythm without S3.  Normal S1 and S2.  ABDOMEN:  Soft, nontender.  EXTREMITIES:  No edema or clubbing.  SKIN:  Clear.   CHEST X-RAY:  Chest x-ray was obtained today and showed complete resolution  of right upper lobe infiltrate.   IMPRESSION:  Positive response to systemic steroids in a patient with  bronchiolitis obliterans organized pneumonia recurrent.   PLAN:  The plan for the patient is to take her prednisone slowly down to 5  mg a day and old at that level.  Will see the patient back in followup in 6  weeks.                                   Charlcie Cradle Delford Field, MD, FCCP   PEW/MedQ  DD:  12/12/2005  DT:  12/14/2005  Job #:  [id number]   cc:   Otilio Connors. Gerri Spore, M.D.

## 2010-08-11 NOTE — Discharge Summary (Signed)
[name], [name]          ACCOUNT NO.:  [id number]   MEDICAL RECORD NO.:  [id number]          PATIENT TYPE:  INP   LOCATION:  1620                         FACILITY:  [facility]   PHYSICIAN:  Jene Every, M.D.    DATE OF BIRTH:  [birth year]   DATE OF ADMISSION:  04/04/2007  DATE OF DISCHARGE:  04/08/2007                               DISCHARGE SUMMARY   ADMISSION DIAGNOSES:  1. Left femoral neck fracture.  2. Hypertension.  3. Esophageal reflux disease.  4. History of pulmonary fibrosis.   DISCHARGE DIAGNOSES:  1. Status post left hemiarthroplasty.  2. Left femoral neck fracture.  3. Hypertension.  4. Esophageal reflux disease.  5. History of pulmonary fibrosis.   HISTORY:  [name] is a 77 year old female who is well known to our  service.  She states earlier today she fell at home.  She was initially  seen at [facility] by one of their medical doctors who did take x-  rays which revealed a left femoral neck fracture.  She was then  transferred to the emergency room, where she was evaluated by Dr. Shelle Iron.  It was felt she would need operative intervention.  We did consult Dr.  Shan Levans, who is her pulmonologist, for clearance from a pulmonary  standpoint.  The risks and benefits of the surgery were discussed with  the patient as well as her family.  Medical clearance was obtained.  She  does wish to proceed.   CONSULTS:  1. PT/OT.  2. Dr. Delford Field.  3. Case management.  4. Rehab.   PROCEDURES:  The patient was taken to the O.R. on April 04, 2007 to  undergo a left hip hemiarthroplasty.  Surgeon:  Dr. Jene Every.  Assistant:  Roma Schanz, PA-C.  Anesthesia:  General.  Complications:  None.   LABORATORY DATA:  Preoperative CBC showed slightly elevated white cell  count of 11, hemoglobin stable at 12.4, hematocrit 36.4.  This was  followed throughout the hospital course.  The patient's white cell count  returned to normal.  At time of discharge,  it was 6.4.  Hemoglobin did  drop to a low of 8, hematocrit 23.1.  The patient was transfused, with  stabilization of her hemoglobin.  At the time of discharge, hemoglobin  was 10.8, hematocrit 31.2.  Coagulation studies done preoperatively  within normal range.  The patient was placed on Coumadin  postoperatively, with an INR of 1.7 at the time of discharge.  Routine  chemistries done preoperatively showed sodium 136, potassium 3.8,  slightly elevated glucose at 130, otherwise normal.  These were  monitored throughout the hospital course.  The patient's sodium did  fluctuate but normalized at time of discharge to 136.  Potassium  remained stable, as well as all other chemistry values.  Calcium was  slightly decreased throughout admission.  Preop urinalysis was negative.  Blood type was O positive.  EKG showed normal sinus rhythm.  Nonspecific  ST and T-wave abnormalities were noted.  Preop chest x-ray showed COPD,  emphysema.  No acute findings were noted on the film.  X-rays of the hip  showed a left  femoral neck fracture.  Postoperative films showed no  acute complications of the left hip hemiarthroplasty.   HOSPITAL COURSE:  The patient was admitted.  She was initially placed in  Buck's traction, transferred to the floor until medical clearance was  obtained.  Dr. Delford Field did give clearance for her to go to surgery.  Later that evening, she was taken to the O.R. and underwent the above-  stated procedure without difficulty.  She was transferred to the PACU  and then to the orthopedic floor for continued postoperative care.  Postoperatively, she was placed on Coumadin for DVT prophylaxis.  PT/OT  was consulted for weightbearing activities.  Postoperatively, the  patient remained stable.  Slight decrease in sodium on postoperative day  #1.  This was adjusted accordingly.  Placement issues were discussed  with the patient as well as her family.  Postop day #2, the patient's  hemoglobin  did drop to 8.  It is felt this point she needed to be  transfused 2 units of packed red blood cells secondary to acute blood  loss anemia.  She was transfused without difficulty.  The following day,  her hemoglobin had increased to 11.1 and 31.7.  Dressing was changed.  The incision was clean and dry.  She was slightly febrile at 99.2.  However, vital signs were stable.  There was no evidence of infection.  Compartments were soft.  No evidence of DVT.  Coumadin was continued per  pharmacy, with a goal INR 2.0.  Foley was discontinued.  Incentive  spirometer was encouraged.  On postop day #4, the patient continued do  very well from an orthopedic as well as a medical standpoint.  It was  felt this point that she could be discharged to a rehab unit.  A bed did  become available.  She was discharged to rehab on April 08, 2007.   DISPOSITION:  The patient was discharged to inpatient rehab on April 08, 2007 for continue postoperative care.  She could be 25%-50%  weightbearing on the lower extremity.  She is to keep the incision clean  and dry.  Change her dressing daily.  Hip precautions were discussed  with the patient.  She will continue with Coumadin over the course of  the next 3-4 weeks.  Dr. Shelle Iron would like to see her in the office in approximately 10-14  days for suture removal as well as x-ray.   DIET:  As tolerated.   CONDITION ON DISCHARGE:  Stable.   FINAL DIAGNOSIS:  Doing well, status post left hip hemiarthroplasty.      Roma Schanz, P.A.      Jene Every, M.D.  Electronically Signed    CS/MEDQ  D:  04/30/2007  T:  05/01/2007  Job:  [id number]

## 2010-08-11 NOTE — Op Note (Signed)
[name], [name]          ACCOUNT NO.:  [id number]   MEDICAL RECORD NO.:  [id number]          PATIENT TYPE:  AMB   LOCATION:  CARD                         FACILITY:  [facility]   PHYSICIAN:  Shan Levans, M.D. LHCDATE OF BIRTH:  02/13/1931   DATE OF PROCEDURE:  01/23/2005  DATE OF DISCHARGE:                                 OPERATIVE REPORT   PROCEDURE:  Bronchoscopy.   CHIEF COMPLAINT:  Evaluate for right upper lobe, right middle lobe  infiltrated with chronic cough.   SURGEON:  Shan Levans, M.D.   ANESTHESIA:  1% Xylocaine local.   PREOP MEDICATION:  Demerol 30 mg, Versed 4 mg IV push.   DESCRIPTION OF PROCEDURE:  The Pentax video bronchoscope was introduced  through the left naris. The upper airways were visualized and were  essentially unremarkable. The entire tracheobronchial tree was visualized  and revealed diffuse tracheobronchitis. Purulence was seen particularly  emanating forth from the right middle lobe orifice. Attention was then paid  here. Microbiologic protective brushing was obtained from the medial segment  of the right middle lobe. Bronchioalveolar lavage was obtained,  approximately 100 mL infused and 50 mL was returned on the lavage. Biopsies  were obtained from the right middle lobe x5. Complications none.   IMPRESSION:  Right middle lobe, right upper lobe infiltrate. Evaluate for  potential for granulomatous lung disease, in particular rule out  mycobacterium __________ infection versus other atypical microbacterial  infection.   RECOMMENDATIONS:  Followup pathology and microbiology.      Shan Levans, M.D. [facility]  Electronically Signed     PW/MEDQ  D:  01/23/2005  T:  01/23/2005  Job:  [id number]   cc:   Otilio Connors. Gerri Spore, M.D.  Fax: [phone]

## 2010-12-14 LAB — CBC
HCT: 28.6 — ABNORMAL LOW
HCT: 36.4
HCT: 30.9 — ABNORMAL LOW
Hemoglobin: 10 — ABNORMAL LOW
Hemoglobin: 11.1 — ABNORMAL LOW
Hemoglobin: 12.4
Hemoglobin: 10.6 — ABNORMAL LOW
MCHC: 34.1
MCHC: 34.6
MCHC: 34.9
MCV: 91.7
MCV: 92.5
Platelets: 196
Platelets: 254
Platelets: 263
Platelets: 272
RBC: 2.5 — ABNORMAL LOW
RBC: 3.94
RDW: 12.7
RDW: 14.3
WBC: 11 — ABNORMAL HIGH
WBC: 6.4
WBC: 5.3

## 2010-12-14 LAB — PROTIME-INR
INR: 0.9
INR: 1.4
INR: 1.5
INR: 1.7 — ABNORMAL HIGH
INR: 1.7 — ABNORMAL HIGH
Prothrombin Time: 12.7
Prothrombin Time: 17.7 — ABNORMAL HIGH
Prothrombin Time: 18.2 — ABNORMAL HIGH
Prothrombin Time: 20.5 — ABNORMAL HIGH
Prothrombin Time: 20.1 — ABNORMAL HIGH

## 2010-12-14 LAB — BASIC METABOLIC PANEL
BUN: 6
BUN: 7
CO2: 28
CO2: 30
Calcium: 8 — ABNORMAL LOW
Calcium: 8.4
Chloride: 94 — ABNORMAL LOW
Creatinine, Ser: 0.64
Creatinine, Ser: 0.64
Creatinine, Ser: 0.69
GFR calc Af Amer: >60
GFR calc non Af Amer: >60
Glucose, Bld: 114 — ABNORMAL HIGH
Glucose, Bld: 96
Potassium: 4.1
Sodium: 136

## 2010-12-14 LAB — COMPREHENSIVE METABOLIC PANEL
ALT: 22
ALT: 50 — ABNORMAL HIGH
Albumin: 2.6 — ABNORMAL LOW
Alkaline Phosphatase: 144 — ABNORMAL HIGH
BUN: 10
BUN: 5 — ABNORMAL LOW
CO2: 28
Calcium: 9.3
Chloride: 101
GFR calc non Af Amer: >60
Glucose, Bld: 130 — ABNORMAL HIGH
Glucose, Bld: 103 — ABNORMAL HIGH
Potassium: 3.5
Sodium: 137
Total Bilirubin: 0.7
Total Protein: 6.5

## 2010-12-14 LAB — URINALYSIS, ROUTINE W REFLEX MICROSCOPIC
Bilirubin Urine: NEGATIVE
Glucose, UA: NEGATIVE
Hgb urine dipstick: NEGATIVE
Specific Gravity, Urine: 1.013
pH: 7.5

## 2010-12-14 LAB — DIFFERENTIAL
Basophils Absolute: 0
Basophils Absolute: 0.1
Basophils Relative: 1
Eosinophils Absolute: 0
Eosinophils Absolute: 0.3
Eosinophils Relative: 0
Eosinophils Relative: 7 — ABNORMAL HIGH
Lymphocytes Relative: 5 — ABNORMAL LOW
Lymphs Abs: 0.6 — ABNORMAL LOW
Monocytes Absolute: 0.4
Monocytes Relative: 8
Neutro Abs: 3.9
Neutrophils Relative %: 91 — ABNORMAL HIGH

## 2010-12-14 LAB — URINE CULTURE: Culture: NO GROWTH

## 2010-12-14 LAB — TYPE AND SCREEN: Antibody Screen: NEGATIVE

## 2010-12-14 LAB — URINALYSIS, MICROSCOPIC ONLY
Leukocytes, UA: NEGATIVE
Protein, ur: NEGATIVE
Urobilinogen, UA: 2 — ABNORMAL HIGH

## 2011-06-01 ENCOUNTER — Encounter: Payer: Self-pay | Admitting: *Deleted

## 2011-07-06 ENCOUNTER — Encounter: Payer: Self-pay | Admitting: *Deleted

## 2011-10-01 ENCOUNTER — Telehealth: Payer: Self-pay | Admitting: Critical Care Medicine

## 2011-10-01 DIAGNOSIS — R0602 Shortness of breath: Secondary | ICD-10-CM

## 2011-10-01 DIAGNOSIS — R05 Cough: Secondary | ICD-10-CM

## 2011-10-01 DIAGNOSIS — M549 Dorsalgia, unspecified: Secondary | ICD-10-CM

## 2011-10-01 NOTE — Telephone Encounter (Signed)
Work her in somehow this week, needs CXR first

## 2011-10-01 NOTE — Telephone Encounter (Signed)
Pt is scheduled to come in 10/02/11 at 1:30 to see PW. Pt aware to have CXR done 1st.

## 2011-10-01 NOTE — Telephone Encounter (Signed)
I spoke with pt and she c/o non productive cough, nasal congestion, and back pain on the right side--feels like it is coming from her lungs x few days now. I offered pt to see another provider in the office and she refused and stated " I will only see Dr. Delford Field and no one else". Pt is requesting to be seen this week. Pt last OV 06/05/10. Please advise Dr. Delford Field thanks

## 2011-10-02 ENCOUNTER — Ambulatory Visit (INDEPENDENT_AMBULATORY_CARE_PROVIDER_SITE_OTHER): Payer: Medicare Other | Admitting: Critical Care Medicine

## 2011-10-02 ENCOUNTER — Encounter: Payer: Self-pay | Admitting: Critical Care Medicine

## 2011-10-02 ENCOUNTER — Ambulatory Visit (INDEPENDENT_AMBULATORY_CARE_PROVIDER_SITE_OTHER)
Admission: RE | Admit: 2011-10-02 | Discharge: 2011-10-02 | Disposition: A | Discharge status: Home / Self Care | Payer: Medicare Other | Source: Ambulatory Visit | Attending: Critical Care Medicine | Admitting: Critical Care Medicine

## 2011-10-02 VITALS — BP 144/94 | HR 68 | Temp 97.8°F | Ht 63.0 in | Wt 116.6 lb

## 2011-10-02 DIAGNOSIS — R05 Cough: Secondary | ICD-10-CM

## 2011-10-02 DIAGNOSIS — R0602 Shortness of breath: Secondary | ICD-10-CM

## 2011-10-02 DIAGNOSIS — M549 Dorsalgia, unspecified: Secondary | ICD-10-CM

## 2011-10-02 DIAGNOSIS — J449 Chronic obstructive pulmonary disease, unspecified: Secondary | ICD-10-CM

## 2011-10-02 DIAGNOSIS — J8410 Pulmonary fibrosis, unspecified: Secondary | ICD-10-CM

## 2011-10-02 MED ORDER — AZITHROMYCIN 250 MG PO TABS: ORAL_TABLET | ORAL | Status: AC

## 2011-10-02 MED ORDER — TIOTROPIUM BROMIDE MONOHYDRATE 18 MCG IN CAPS: 18.0000 ug | ORAL_CAPSULE | Freq: Every day | RESPIRATORY_TRACT | Status: DC

## 2011-10-02 MED ORDER — PREDNISONE 10 MG PO TABS: ORAL_TABLET | ORAL | Status: DC

## 2011-10-02 NOTE — Progress Notes (Signed)
Subjective:    Patient ID: [name], female    DOB: [birth year], 78 y.o.   MRN: [id number]  HPI History of Present Illness:  78 y.o. female with known history of BOOP/COP.      10/02/2011 Pt noting more cough, dyspnea, chest discomfort for 4days.  Nonproductive cough.  Notes a dull ache. No fever.  Feels fatigued.  Notes nasal stuffiness and ear congestion. Pt denies any significant sore throat, nasal congestion or excess secretions, fever, chills, sweats, unintended weight loss, pleurtic or exertional chest pain, orthopnea PND, or leg swelling Pt denies any increase in rescue therapy over baseline, denies waking up needing it or having any early am or nocturnal exacerbations of coughing/wheezing/or dyspnea. Pt also denies any obvious fluctuation in symptoms with  weather or environmental change or other alleviating or aggravating factors     Review of Systems Constitutional:   No  weight loss, night sweats,  Fevers, chills, fatigue, lassitude. HEENT:   No headaches,  Difficulty swallowing,  Tooth/dental problems,  Sore throat,                No sneezing, itching, ear ache, nasal congestion, post nasal drip,   CV:  Notes  chest pain,  Orthopnea, PND, swelling in lower extremities, anasarca, dizziness, palpitations  GI  No heartburn, indigestion, abdominal pain, nausea, vomiting, diarrhea, change in bowel habits, loss of appetite  Resp: Notes  shortness of breath with exertion not at rest.  No excess mucus, no productive cough,  Notes  non-productive cough,  No coughing up of blood.  No change in color of mucus.  No wheezing.  No chest wall deformity  Skin: no rash or lesions.  GU: no dysuria, change in color of urine, no urgency or frequency.  No flank pain.  MS:  No joint pain or swelling.  No decreased range of motion.  No back pain.  Psych:  No change in mood or affect. No depression or anxiety.  No memory loss.     Objective:   Physical Exam   Filed Vitals:     10/02/11 1345  BP: 144/94  Pulse: 68  Temp: 97.8 F (36.6 C)  TempSrc: Oral  Height: 5\' 3"  (1.6 m)  Weight: 116 lb 9.6 oz (52.889 kg)  SpO2: 97%    Gen: Pleasant, well-nourished, in no distress,  normal affect  ENT: No lesions,  mouth clear,  oropharynx clear, no postnasal drip  Neck: No JVD, no TMG, no carotid bruits  Lungs: No use of accessory muscles, no dullness to percussion, expired wheezes  Cardiovascular: RRR, heart sounds normal, no murmur or gallops, no peripheral edema  Abdomen: soft and NT, no HSM,  BS normal  Musculoskeletal: No deformities, no cyanosis or clubbing  Neuro: alert, non focal  Skin: Warm, no lesions or rashes  Dg Chest 2 View  10/02/2011  *RADIOLOGY REPORT*  Clinical Data: Follow up right lower lobe lung nodules.  CHEST - 2 VIEW  Comparison: Two-view chest x-ray 05/11/2010, 03/31/2009, 04/04/2007.  CT chest 03/31/2009.  Findings: Cardiac silhouette mildly enlarged but stable.  Thoracic aorta mildly tortuous and atherosclerotic, unchanged.  Hilar and mediastinal contours otherwise unremarkable.  Hyperinflation and mild emphysematous changes throughout both lungs, unchanged. Linear scarring in the right middle lobe and lingula, unchanged. Two adjacent calcified granulomata in the posterior right lower lobe are less well visualized on the current examination.  No new pulmonary parenchymal abnormalities.  Degenerative changes involving the thoracic spine.  IMPRESSION: Stable cardiomegaly.  COPD/emphysema.  No acute cardiopulmonary disease.  2 adjacent calcified granulomata in the posterior right lower lobe, identified on prior examinations, not visualized on the current examination.  Original Report Authenticated By: Arnell Sieving, M.D.        Assessment & Plan:   COPD (chronic obstructive pulmonary disease) Gold stage B. 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 calcium citrate-vitamin D (CITRACAL+D) 315-200 MG-UNIT per tablet Take 1 tablet by mouth 2 (two) times daily. When remembers      . Cyanocobalamin (VITAMIN B-12 SL) Place 1 tablet under the tongue daily. When remembers      . fluticasone (FLONASE) 50 MCG/ACT nasal spray Place 2 sprays into the nose daily as needed.       . loratadine (CLARITIN) 10 MG tablet Take 10 mg by mouth daily as needed.      Marland Kitchen LORazepam (ATIVAN) 0.5 MG tablet Take 0.5 mg by mouth at bedtime.       Marland Kitchen olmesartan (BENICAR) 5 MG tablet Take 5 mg by mouth daily.      Marland Kitchen DISCONTD: calcium-vitamin D (OSCAL) 250-125 MG-UNIT per tablet Take 1 tablet by mouth daily. When remembers      . azithromycin (ZITHROMAX) 250 MG tablet Take two once then one daily until gone  6 each  0  . predniSONE (DELTASONE) 10 MG tablet Take 4 for three days 3 for three days 2 for three days 1 for three days and stop  30 tablet  0  . tiotropium (SPIRIVA HANDIHALER) 18 MCG inhalation capsule Place 1 capsule (18 mcg total) into inhaler and inhale daily.  30 capsule  11  . DISCONTD: tiotropium (SPIRIVA) 18 MCG inhalation capsule Place 18 mcg into inhaler and inhale daily.

## 2011-10-02 NOTE — Patient Instructions (Signed)
Resume spiriva Prednisone 10mg      4 daily for 5 days Azithromycin 250mg  Take two once then one daily until gone Return 2 months

## 2011-10-03 DIAGNOSIS — J449 Chronic obstructive pulmonary disease, unspecified: Secondary | ICD-10-CM | POA: Insufficient documentation

## 2011-10-03 NOTE — Assessment & Plan Note (Signed)
Gold stage B. COPD with airflow obstruction acute tracheobronchitis Plan Restart Spiriva Pulse prednisone Azithromycin for 5 days

## 2011-10-03 NOTE — Assessment & Plan Note (Signed)
History of bronchiolitis obliterans organized pneumonia stable at this time without recurrence Plan Treat tracheobronchitis

## 2011-12-04 ENCOUNTER — Ambulatory Visit (INDEPENDENT_AMBULATORY_CARE_PROVIDER_SITE_OTHER): Payer: Medicare Other | Admitting: Critical Care Medicine

## 2011-12-04 ENCOUNTER — Encounter: Payer: Self-pay | Admitting: Critical Care Medicine

## 2011-12-04 VITALS — BP 136/84 | HR 74 | Temp 97.8°F | Ht 64.0 in | Wt 116.4 lb

## 2011-12-04 DIAGNOSIS — Z23 Encounter for immunization: Secondary | ICD-10-CM

## 2011-12-04 DIAGNOSIS — J449 Chronic obstructive pulmonary disease, unspecified: Secondary | ICD-10-CM

## 2011-12-04 NOTE — Assessment & Plan Note (Signed)
Gold B Copd improved with recent exacerbation. Now not using spiriva and is stable Plan  stay off spiriva for now Resume if reexacerbates Flu vaccine today 12/04/2011

## 2011-12-04 NOTE — Patient Instructions (Signed)
Flu vaccine today Call us with date of your pneumovax from your PCP MD Ok to use spiriva as needed Return 6 months

## 2011-12-04 NOTE — Progress Notes (Signed)
Subjective:    Patient ID: [name], female    DOB: Oct 02, 1930, 78 y.o.   MRN: [id number]  HPI  History of Present Illness:  78 y.o. female with known history of BOOP/COP. Gold B Copd     7/9 Pt noting more cough, dyspnea, chest discomfort for 4days.  Nonproductive cough.  Notes a dull ache. No fever.  Feels fatigued.  Notes nasal stuffiness and ear congestion. Pt denies any significant sore throat, nasal congestion or excess secretions, fever, chills, sweats, unintended weight loss, pleurtic or exertional chest pain, orthopnea PND, or leg swelling Pt denies any increase in rescue therapy over baseline, denies waking up needing it or having any early am or nocturnal exacerbations of coughing/wheezing/or dyspnea. Pt also denies any obvious fluctuation in symptoms with  weather or environmental change or other alleviating or aggravating factors  12/04/2011 No new issues, cough is better.  Dyspnea and chest pain are better.  No fever,chills or sweats. Fatigue is better.   Pt denies any significant sore throat, nasal congestion or excess secretions, fever, chills, sweats, unintended weight loss, pleurtic or exertional chest pain, orthopnea PND, or leg swelling Pt denies any increase in rescue therapy over baseline, denies waking up needing it or having any early am or nocturnal exacerbations of coughing/wheezing/or dyspnea. Pt also denies any obvious fluctuation in symptoms with  weather or environmental change or other alleviating or aggravating factors     Review of Systems  Constitutional:   No  weight loss, night sweats,  Fevers, chills, fatigue, lassitude. HEENT:   No headaches,  Difficulty swallowing,  Tooth/dental problems,  Sore throat,                No sneezing, itching, ear ache, nasal congestion, post nasal drip,   CV:  Notes  chest pain,  Orthopnea, PND, swelling in lower extremities, anasarca, dizziness, palpitations  GI  No heartburn, indigestion, abdominal  pain, nausea, vomiting, diarrhea, change in bowel habits, loss of appetite  Resp: No  shortness of breath with exertion and not at rest.  No excess mucus, no productive cough,  Notes  non-productive cough,  No coughing up of blood.  No change in color of mucus.  No wheezing.  No chest wall deformity  Skin: no rash or lesions.  GU: no dysuria, change in color of urine, no urgency or frequency.  No flank pain.  MS:  No joint pain or swelling.  No decreased range of motion.  No back pain.  Psych:  No change in mood or affect. No depression or anxiety.  No memory loss.     Objective:   Physical Exam    Filed Vitals:   12/04/11 0909  BP: 136/84  Pulse: 74  Temp: 97.8 F (36.6 C)  TempSrc: Oral  Height: 5\' 4"  (1.626 m)  Weight: 116 lb 6.4 oz (52.799 kg)  SpO2: 98%    Gen: Pleasant, well-nourished, in no distress,  normal affect  ENT: No lesions,  mouth clear,  oropharynx clear, no postnasal drip  Neck: No JVD, no TMG, no carotid bruits  Lungs: No use of accessory muscles, no dullness to percussion, clearer, distant BS  Cardiovascular: RRR, heart sounds normal, no murmur or gallops, no peripheral edema  Abdomen: soft and NT, no HSM,  BS normal  Musculoskeletal: No deformities, no cyanosis or clubbing  Neuro: alert, non focal  Skin: Warm, no lesions or rashes  No results found.      Assessment & Plan:  COPD (chronic obstructive pulmonary disease) Gold B Copd improved with recent exacerbation. Now not using spiriva and is stable Plan  stay off spiriva for now Resume if reexacerbates Flu vaccine today 12/04/2011     Updated Medication List Outpatient Encounter Prescriptions as of 12/04/2011  Medication Sig Dispense Refill  . calcium citrate-vitamin D (CITRACAL+D) 315-200 MG-UNIT per tablet Take 1 tablet by mouth 2 (two) times daily. When remembers      . Cyanocobalamin (VITAMIN B-12 SL) Place 1 tablet under the tongue daily. When remembers      . fluticasone  (FLONASE) 50 MCG/ACT nasal spray Place 2 sprays into the nose daily as needed.       Marland Kitchen LORazepam (ATIVAN) 0.5 MG tablet Take 0.5 mg by mouth at bedtime.       Marland Kitchen olmesartan (BENICAR) 5 MG tablet Take 5 mg by mouth daily.      Marland Kitchen tiotropium (SPIRIVA) 18 MCG inhalation capsule Place 18 mcg into inhaler and inhale daily as needed.      Marland Kitchen DISCONTD: tiotropium (SPIRIVA HANDIHALER) 18 MCG inhalation capsule Place 1 capsule (18 mcg total) into inhaler and inhale daily.  30 capsule  11  . DISCONTD: loratadine (CLARITIN) 10 MG tablet Take 10 mg by mouth daily as needed.      Marland Kitchen DISCONTD: predniSONE (DELTASONE) 10 MG tablet Take 4 for three days 3 for three days 2 for three days 1 for three days and stop  30 tablet  0

## 2012-06-25 ENCOUNTER — Ambulatory Visit: Payer: Medicare Other | Admitting: Critical Care Medicine

## 2012-07-16 ENCOUNTER — Telehealth: Payer: Self-pay | Admitting: Critical Care Medicine

## 2012-07-16 MED ORDER — LEVOFLOXACIN 500 MG PO TABS: 500.0000 mg | ORAL_TABLET | Freq: Every day | ORAL | Status: DC

## 2012-07-16 MED ORDER — PREDNISONE 10 MG PO TABS: ORAL_TABLET | ORAL | Status: DC

## 2012-07-16 NOTE — Telephone Encounter (Signed)
Call in levaquin 500mg  /d x 5 days Call in prednisone 10mg   Take 4 tablets daily for 5 days then stop #20

## 2012-07-16 NOTE — Telephone Encounter (Signed)
Rx's have been sent in to [facility] in [place], [place] per the pt's request.

## 2012-07-16 NOTE — Telephone Encounter (Signed)
Called, spoke with pt.  C/o "intense" nonprod cough, congestion in throat, "not much" PND, and skin sensitivity mainly on back (reports this usually comes along with these symptoms).  Symptoms started this week when she returned from [place].  Denies SOB, wheezing, chest tightness, chest pain, f/c/s at this time.  She is currently in [place].  Unable to come in for OV until next week when she returns from [place].  We have scheduled her to see PW on Monday, April 28 at 10:30 am in [place].  Pt is aware.  She has tried Robitussin with no relief.  COPD pt.  Dr. Delford Field, do you have any recs for pt prior to appt?  Please advise.  Thank you.  Allergies  Allergen Reactions  . Codeine    ** If rxs need to be sent in, pt will let us know pharm when we call her back.

## 2012-07-21 ENCOUNTER — Ambulatory Visit (INDEPENDENT_AMBULATORY_CARE_PROVIDER_SITE_OTHER): Payer: Medicare Other | Admitting: Critical Care Medicine

## 2012-07-21 ENCOUNTER — Encounter: Payer: Self-pay | Admitting: Critical Care Medicine

## 2012-07-21 VITALS — BP 150/98 | HR 75 | Temp 97.8°F | Ht 63.0 in | Wt 113.0 lb

## 2012-07-21 DIAGNOSIS — J449 Chronic obstructive pulmonary disease, unspecified: Secondary | ICD-10-CM

## 2012-07-21 DIAGNOSIS — J4489 Other specified chronic obstructive pulmonary disease: Secondary | ICD-10-CM

## 2012-07-21 MED ORDER — TIOTROPIUM BROMIDE MONOHYDRATE 18 MCG IN CAPS: 18.0000 ug | ORAL_CAPSULE | Freq: Every day | RESPIRATORY_TRACT | Status: DC | PRN

## 2012-07-21 NOTE — Assessment & Plan Note (Signed)
Chronic obstructive lung disease gold stage B. COPD with recent exacerbation now improved Note this patient is not taking Spiriva on a regular basis Plan Stay on spiriva No further prednisone or antibiotic Have primary care recheck Blood Pressure soon Return 6 months

## 2012-07-21 NOTE — Patient Instructions (Addendum)
Stay on spiriva No further prednisone or antibiotic Have primary care recheck Blood Pressure soon Return 6 months

## 2012-07-21 NOTE — Progress Notes (Signed)
Subjective:    Patient ID: [name], female    DOB: March 24, 1931, 79 y.o.   MRN: [id number]  HPI  History of Present Illness:  79 y.o. female with known history of BOOP/COP. Gold B Copd  07/21/2012 Pt became ill last week with cough and mucus. Rx levaquin and prednisone pulse  Now: Pt ill over 1 week . Pt in [place], and air travel. Pt then went to [place] to keep grandchildren.  Then developed cough, congestion and throat congestion  Felt bad all over. No fever No wheezing.  Not that dyspneic.  No chest pain, but sore on RLL area.   Now finished ABX/prednisone  and just finished, now is better but fatigued. Off spiriva x one year.  Review of Systems  Constitutional:   No  weight loss, night sweats,  Fevers, chills, fatigue, lassitude. HEENT:   No headaches,  Difficulty swallowing,  Tooth/dental problems,  Sore throat,                No sneezing, itching, ear ache, nasal congestion, post nasal drip,   CV:  Notes  chest pain,  Orthopnea, PND, swelling in lower extremities, anasarca, dizziness, palpitations  GI  No heartburn, indigestion, abdominal pain, nausea, vomiting, diarrhea, change in bowel habits, loss of appetite  Resp: No  shortness of breath with exertion and not at rest.  No excess mucus, no productive cough,  Notes  non-productive cough,  No coughing up of blood.  No change in color of mucus.  No wheezing.  No chest wall deformity  Skin: no rash or lesions.  GU: no dysuria, change in color of urine, no urgency or frequency.  No flank pain.  MS:  No joint pain or swelling.  No decreased range of motion.  No back pain.  Psych:  No change in mood or affect. No depression or anxiety.  No memory loss.     Objective:   Physical Exam  Filed Vitals:   07/21/12 1024  BP: 150/98  Pulse: 75  Temp: 97.8 F (36.6 C)  TempSrc: Oral  Height: 5\' 3"  (1.6 m)  Weight: 113 lb (51.256 kg)  SpO2: 98%    Gen: Pleasant, well-nourished, in no distress,  normal  affect  ENT: No lesions,  mouth clear,  oropharynx clear, no postnasal drip  Neck: No JVD, no TMG, no carotid bruits  Lungs: No use of accessory muscles, no dullness to percussion, clearer, distant BS  Cardiovascular: RRR, heart sounds normal, no murmur or gallops, no peripheral edema  Abdomen: soft and NT, no HSM,  BS normal  Musculoskeletal: No deformities, no cyanosis or clubbing  Neuro: alert, non focal  Skin: Warm, no lesions or rashes  No results found.      Assessment & Plan:   COPD (chronic obstructive pulmonary disease) Chronic obstructive lung disease gold stage B. COPD with recent exacerbation now improved Note this patient is not taking Spiriva on a regular basis Plan Stay on spiriva No further prednisone or antibiotic Have primary care recheck Blood Pressure soon Return 6 months     Updated Medication List Outpatient Encounter Prescriptions as of 07/21/2012  Medication Sig Dispense Refill  . cholecalciferol (VITAMIN D) 400 UNITS TABS Take 400 Units by mouth daily.      . Cyanocobalamin (VITAMIN B-12 SL) Place 1 tablet under the tongue daily. When remembers      . fluticasone (FLONASE) 50 MCG/ACT nasal spray Place 2 sprays into the nose daily as needed.       Marland Kitchen  LORazepam (ATIVAN) 0.5 MG tablet Take 0.5 mg by mouth at bedtime.       Marland Kitchen olmesartan (BENICAR) 5 MG tablet Take 5 mg by mouth daily.      Marland Kitchen tiotropium (SPIRIVA) 18 MCG inhalation capsule Place 1 capsule (18 mcg total) into inhaler and inhale daily as needed.  30 capsule  6  . [DISCONTINUED] calcium citrate-vitamin D (CITRACAL+D) 315-200 MG-UNIT per tablet Take 1 tablet by mouth 2 (two) times daily. When remembers      . [DISCONTINUED] levofloxacin (LEVAQUIN) 500 MG tablet Take 1 tablet (500 mg total) by mouth daily.  5 tablet  0  . [DISCONTINUED] predniSONE (DELTASONE) 10 MG tablet Take 4 tablets daily for 5 days  20 tablet  0  . [DISCONTINUED] tiotropium (SPIRIVA) 18 MCG inhalation capsule Place 18  mcg into inhaler and inhale daily as needed.       No facility-administered encounter medications on file as of 07/21/2012.

## 2012-08-12 ENCOUNTER — Ambulatory Visit: Payer: Medicare Other | Admitting: Critical Care Medicine

## 2013-02-02 ENCOUNTER — Encounter: Payer: Self-pay | Admitting: Critical Care Medicine

## 2013-02-02 ENCOUNTER — Ambulatory Visit (INDEPENDENT_AMBULATORY_CARE_PROVIDER_SITE_OTHER): Payer: Medicare Other | Admitting: Critical Care Medicine

## 2013-02-02 VITALS — BP 128/82 | HR 67 | Temp 97.4°F | Ht 63.0 in | Wt 119.0 lb

## 2013-02-02 DIAGNOSIS — J449 Chronic obstructive pulmonary disease, unspecified: Secondary | ICD-10-CM

## 2013-02-02 DIAGNOSIS — Z23 Encounter for immunization: Secondary | ICD-10-CM

## 2013-02-02 NOTE — Progress Notes (Signed)
Subjective:    Patient ID: [name], female    DOB: [birth year], 79 y.o.   MRN: [id number]  HPI  History of Present Illness:  79 y.o. female with known history of BOOP/COP. Gold B Copd  02/02/2013 Chief Complaint  Patient presents with  . Follow-up    Pt has no complaints at this time.  CAT score 3  No major issues overall.   Does not use spiriva every day.  Off of this for two months  Review of Systems  Constitutional:   No  weight loss, night sweats,  Fevers, chills, fatigue, lassitude. HEENT:   No headaches,  Difficulty swallowing,  Tooth/dental problems,  Sore throat,                No sneezing, itching, ear ache, nasal congestion, post nasal drip,   CV:  Notes  chest pain,  Orthopnea, PND, swelling in lower extremities, anasarca, dizziness, palpitations  GI  No heartburn, indigestion, abdominal pain, nausea, vomiting, diarrhea, change in bowel habits, loss of appetite  Resp: No  shortness of breath with exertion and not at rest.  No excess mucus, no productive cough,  Notes  non-productive cough,  No coughing up of blood.  No change in color of mucus.  No wheezing.  No chest wall deformity  Skin: no rash or lesions.  GU: no dysuria, change in color of urine, no urgency or frequency.  No flank pain.  MS:  No joint pain or swelling.  No decreased range of motion.  No back pain.  Psych:  No change in mood or affect. No depression or anxiety.  No memory loss.     Objective:   Physical Exam  Filed Vitals:   02/02/13 1136  BP: 128/82  Pulse: 67  Temp: 97.4 F (36.3 C)  TempSrc: Oral  Height: 5\' 3"  (1.6 m)  Weight: 119 lb (53.978 kg)  SpO2: 98%    Gen: Pleasant, well-nourished, in no distress,  normal affect  ENT: No lesions,  mouth clear,  oropharynx clear, no postnasal drip  Neck: No JVD, no TMG, no carotid bruits  Lungs: No use of accessory muscles, no dullness to percussion, clearer, distant BS  Cardiovascular: RRR, heart sounds normal, no  murmur or gallops, no peripheral edema  Abdomen: soft and NT, no HSM,  BS normal  Musculoskeletal: No deformities, no cyanosis or clubbing  Neuro: alert, non focal  Skin: Warm, no lesions or rashes  No results found.      Assessment & Plan:   COPD (chronic obstructive pulmonary disease) Gold stage B. COPD with lower airway inflammation now improved and mild airway obstruction documented on previous spirometry April 2014 Plan Maintains off Spiriva for now Prevnar 13 vaccine was issued Return 6 mo    Updated Medication List Outpatient Encounter Prescriptions as of 02/02/2013  Medication Sig  . cholecalciferol (VITAMIN D) 400 UNITS TABS Take 400 Units by mouth daily.  . fluticasone (FLONASE) 50 MCG/ACT nasal spray Place 2 sprays into the nose daily as needed.   Marland Kitchen LORazepam (ATIVAN) 0.5 MG tablet Take 0.5 mg by mouth at bedtime.   . Multiple Vitamin (MULTIVITAMIN) tablet Take 1 tablet by mouth daily.  Marland Kitchen olmesartan (BENICAR) 5 MG tablet Take 5 mg by mouth daily.  Marland Kitchen tiotropium (SPIRIVA) 18 MCG inhalation capsule Place 1 capsule (18 mcg total) into inhaler and inhale daily as needed.  . [DISCONTINUED] Cyanocobalamin (VITAMIN B-12 SL) Place 1 tablet under the tongue daily. When remembers

## 2013-02-02 NOTE — Patient Instructions (Signed)
Stop Spiriva Prevnar 13 will be administered Return 6 months or as needed

## 2013-02-02 NOTE — Assessment & Plan Note (Signed)
Gold stage B. COPD with lower airway inflammation now improved and mild airway obstruction documented on previous spirometry April 2014 Plan Maintains off Spiriva for now Prevnar 13 vaccine was issued Return 6 mo

## 2013-05-15 ENCOUNTER — Encounter: Payer: Self-pay | Admitting: Cardiology

## 2013-07-31 ENCOUNTER — Encounter: Payer: Self-pay | Admitting: Critical Care Medicine

## 2013-07-31 ENCOUNTER — Ambulatory Visit (INDEPENDENT_AMBULATORY_CARE_PROVIDER_SITE_OTHER): Payer: Medicare Other | Admitting: Critical Care Medicine

## 2013-07-31 VITALS — BP 128/84 | HR 73 | Temp 96.8°F | Ht 62.25 in | Wt 114.2 lb

## 2013-07-31 DIAGNOSIS — J449 Chronic obstructive pulmonary disease, unspecified: Secondary | ICD-10-CM

## 2013-07-31 NOTE — Patient Instructions (Signed)
Ok to stay off spiriva No other changes Return 4 months

## 2013-08-01 NOTE — Progress Notes (Signed)
Subjective:    Patient ID: [name], female    DOB: 1930-09-11, 80 y.o.   MRN: [id number]  HPI  History of Present Illness:  80 y.o. female with known history of BOOP/COP. [place]  Chief Complaint  Patient presents with  . 6 month follow up    Breathing doing well overall.  Does have a slight, nonprod cough, runny nose, and sneezing over the past month.    No SOB, wheezing, or chest tightness/pain.    No new issues. Not using spiriva Pt denies any significant sore throat, nasal congestion or excess secretions, fever, chills, sweats, unintended weight loss, pleurtic or exertional chest pain, orthopnea PND, or leg swelling Pt denies any increase in rescue therapy over baseline, denies waking up needing it or having any early am or nocturnal exacerbations of coughing/wheezing/or dyspnea. Pt also denies any obvious fluctuation in symptoms with  weather or environmental change or other alleviating or aggravating factors    Review of Systems  Constitutional:   No  weight loss, night sweats,  Fevers, chills, fatigue, lassitude. HEENT:   No headaches,  Difficulty swallowing,  Tooth/dental problems,  Sore throat,                No sneezing, itching, ear ache, nasal congestion, post nasal drip,   CV:  Notes  chest pain,  Orthopnea, PND, swelling in lower extremities, anasarca, dizziness, palpitations  GI  No heartburn, indigestion, abdominal pain, nausea, vomiting, diarrhea, change in bowel habits, loss of appetite  Resp: No  shortness of breath with exertion and not at rest.  No excess mucus, no productive cough,  No  non-productive cough,  No coughing up of blood.  No change in color of mucus.  No wheezing.  No chest wall deformity  Skin: no rash or lesions.  GU: no dysuria, change in color of urine, no urgency or frequency.  No flank pain.  MS:  No joint pain or swelling.  No decreased range of motion.  No back pain.  Psych:  No change in mood or affect. No depression  or anxiety.  No memory loss.     Objective:   Physical Exam  Filed Vitals:   07/31/13 0951  BP: 128/84  Pulse: 73  Temp: 96.8 F (36 C)  TempSrc: Oral  Height: 5' 2.25" (1.581 m)  Weight: 114 lb 3.2 oz (51.801 kg)  SpO2: 100%    Gen: Pleasant, well-nourished, in no distress,  normal affect  ENT: No lesions,  mouth clear,  oropharynx clear, no postnasal drip  Neck: No JVD, no TMG, no carotid bruits  Lungs: No use of accessory muscles, no dullness to percussion, clearer, distant BS  Cardiovascular: RRR, heart sounds normal, no murmur or gallops, no peripheral edema  Abdomen: soft and NT, no HSM,  BS normal  Musculoskeletal: No deformities, no cyanosis or clubbing  Neuro: alert, non focal  Skin: Warm, no lesions or rashes  No results found.      Assessment & Plan:   COPD (chronic obstructive pulmonary disease) Gold stage B Copd stable off spiriva Plan  Ok to stay off spiriva No other changes     Updated Medication List Outpatient Encounter Prescriptions as of 07/31/2013  Medication Sig  . cholecalciferol (VITAMIN D) 400 UNITS TABS Take 400 Units by mouth daily.  . Cyanocobalamin (VITAMIN B-12 PO) Take 1,000 Units by mouth every other day.  . fluticasone (FLONASE) 50 MCG/ACT nasal spray Place 2 sprays into the nose daily  as needed.   Marland Kitchen LORazepam (ATIVAN) 0.5 MG tablet Take 0.5 mg by mouth at bedtime.   . Multiple Vitamin (MULTIVITAMIN) tablet Take 1 tablet by mouth daily.  Marland Kitchen olmesartan (BENICAR) 5 MG tablet Take 5 mg by mouth daily.  Marland Kitchen tiotropium (SPIRIVA) 18 MCG inhalation capsule Place 1 capsule (18 mcg total) into inhaler and inhale daily as needed.

## 2013-08-01 NOTE — Assessment & Plan Note (Signed)
Gold stage B Copd stable off spiriva Plan  Ok to stay off spiriva No other changes

## 2013-12-04 ENCOUNTER — Encounter: Payer: Self-pay | Admitting: Critical Care Medicine

## 2013-12-14 ENCOUNTER — Ambulatory Visit: Payer: Medicare Other | Admitting: Critical Care Medicine

## 2014-01-04 ENCOUNTER — Ambulatory Visit (INDEPENDENT_AMBULATORY_CARE_PROVIDER_SITE_OTHER): Payer: Medicare Other | Admitting: Critical Care Medicine

## 2014-01-04 ENCOUNTER — Encounter: Payer: Self-pay | Admitting: Critical Care Medicine

## 2014-01-04 ENCOUNTER — Encounter (INDEPENDENT_AMBULATORY_CARE_PROVIDER_SITE_OTHER): Payer: Self-pay

## 2014-01-04 VITALS — BP 150/94 | HR 81 | Ht 62.5 in | Wt 116.6 lb

## 2014-01-04 DIAGNOSIS — Z23 Encounter for immunization: Secondary | ICD-10-CM

## 2014-01-04 DIAGNOSIS — J449 Chronic obstructive pulmonary disease, unspecified: Secondary | ICD-10-CM

## 2014-01-04 NOTE — Assessment & Plan Note (Signed)
Gold B copd moderate airway obstruction Plan No indication for chronic spiriva Flu vaccine given Return 6 months

## 2014-01-04 NOTE — Progress Notes (Signed)
Subjective:    Patient ID: [name], female    DOB: 09/30/1930, 80 y.o.   MRN: [id number]  HPI 01/04/2014 Chief Complaint  Patient presents with  . 4 month follow up    Breathing doing well overall.  Has a slight, nonprod cough and middle to lower back pain.  No SOB or wheezing.   Doing well , min dyspnea and cough. Pt denies any significant sore throat, nasal congestion or excess secretions, fever, chills, sweats, unintended weight loss, pleurtic or exertional chest pain, orthopnea PND, or leg swelling Pt denies any increase in rescue therapy over baseline, denies waking up needing it or having any early am or nocturnal exacerbations of coughing/wheezing/or dyspnea. Pt also denies any obvious fluctuation in symptoms with  weather or environmental change or other alleviating or aggravating factors     Review of Systems Constitutional:   No  weight loss, night sweats,  Fevers, chills, fatigue, lassitude. HEENT:   No headaches,  Difficulty swallowing,  Tooth/dental problems,  Sore throat,                No sneezing, itching, ear ache, nasal congestion, post nasal drip,   CV:  No chest pain,  Orthopnea, PND, swelling in lower extremities, anasarca, dizziness, palpitations  GI  No heartburn, indigestion, abdominal pain, nausea, vomiting, diarrhea, change in bowel habits, loss of appetite  Resp: No shortness of breath with exertion or at rest.  No excess mucus, no productive cough,  No non-productive cough,  No coughing up of blood.  No change in color of mucus.  No wheezing.  No chest wall deformity  Skin: no rash or lesions.  GU: no dysuria, change in color of urine, no urgency or frequency.  No flank pain.  MS:  No joint pain or swelling.  No decreased range of motion.  No back pain.  Psych:  No change in mood or affect. No depression or anxiety.  No memory loss.     Objective:   Physical Exam Filed Vitals:   01/04/14 1019  BP: 150/94  Pulse: 81  Height: 5'  2.5" (1.588 m)  Weight: 116 lb 9.6 oz (52.889 kg)  SpO2: 99%    Gen: Pleasant, well-nourished, in no distress,  normal affect  ENT: No lesions,  mouth clear,  oropharynx clear, no postnasal drip  Neck: No JVD, no TMG, no carotid bruits  Lungs: No use of accessory muscles, no dullness to percussion, clear without rales or rhonchi  Cardiovascular: RRR, heart sounds normal, no murmur or gallops, no peripheral edema  Abdomen: soft and NT, no HSM,  BS normal  Musculoskeletal: No deformities, no cyanosis or clubbing  Neuro: alert, non focal  Skin: Warm, no lesions or rashes  No results found.        Assessment & Plan:   COPD (chronic obstructive pulmonary disease) Gold B copd moderate airway obstruction Plan No indication for chronic spiriva Flu vaccine given Return 6 months   Updated Medication List Outpatient Encounter Prescriptions as of 01/04/2014  Medication Sig  . cholecalciferol (VITAMIN D) 400 UNITS TABS Take 400 Units by mouth daily.  . Cyanocobalamin (VITAMIN B-12 PO) Take 1,000 Units by mouth every other day.  . fluticasone (FLONASE) 50 MCG/ACT nasal spray Place 2 sprays into the nose daily as needed.   Marland Kitchen LORazepam (ATIVAN) 0.5 MG tablet Take 0.5 mg by mouth at bedtime.   . Multiple Vitamin (MULTIVITAMIN) tablet Take 1 tablet by mouth daily.  Marland Kitchen olmesartan (BENICAR)  5 MG tablet Take 5 mg by mouth daily.  Marland Kitchen tiotropium (SPIRIVA) 18 MCG inhalation capsule Place 1 capsule (18 mcg total) into inhaler and inhale daily as needed.

## 2014-01-04 NOTE — Patient Instructions (Addendum)
Flu vaccine  No change in medications Return 4 months

## 2014-04-29 ENCOUNTER — Ambulatory Visit (INDEPENDENT_AMBULATORY_CARE_PROVIDER_SITE_OTHER): Payer: Medicare Other | Admitting: Podiatry

## 2014-04-29 ENCOUNTER — Ambulatory Visit (INDEPENDENT_AMBULATORY_CARE_PROVIDER_SITE_OTHER): Payer: Medicare Other

## 2014-04-29 ENCOUNTER — Encounter: Payer: Self-pay | Admitting: Podiatry

## 2014-04-29 VITALS — BP 148/89 | HR 76 | Resp 16

## 2014-04-29 DIAGNOSIS — M2041 Other hammer toe(s) (acquired), right foot: Secondary | ICD-10-CM

## 2014-04-29 DIAGNOSIS — M779 Enthesopathy, unspecified: Secondary | ICD-10-CM

## 2014-04-29 DIAGNOSIS — M2011 Hallux valgus (acquired), right foot: Secondary | ICD-10-CM

## 2014-04-29 DIAGNOSIS — L84 Corns and callosities: Secondary | ICD-10-CM

## 2014-04-29 MED ORDER — TRIAMCINOLONE ACETONIDE 10 MG/ML IJ SUSP
10.0000 mg | Freq: Once | INTRAMUSCULAR | Status: AC
  Administered 2014-04-29: 10 mg

## 2014-04-29 NOTE — Progress Notes (Signed)
   Subjective:    Patient ID: [name], female    DOB: 11-22-1930, 81 y.o.   MRN: [id number]  HPI Comments: "I have a bunion and my toe has all this arthritis"  Patient c/o aching 1st MPJ and 2nd toe right for several years. There is a large callused area 2nd toe interdigitally. There is some redness. Some shoes are uncomfortable. She would at some point like the deformities surgically corrected, but right now she is taking care of her 48 year old dog and is unable to. Would like conservative treatment today.  Foot Pain Associated symptoms include abdominal pain, arthralgias and coughing.      Review of Systems  Constitutional: Positive for unexpected weight change.  Respiratory: Positive for cough.   Gastrointestinal: Positive for abdominal pain.  Musculoskeletal: Positive for back pain and arthralgias.  Hematological: Bruises/bleeds easily.  All other systems reviewed and are negative.      Objective:   Physical Exam        Assessment & Plan:

## 2014-04-30 NOTE — Progress Notes (Signed)
Subjective:     Patient ID: [name], female   DOB: 11-May-1930, 81 y.o.   MRN: [id number]  HPI patient presents stating I have a significant bunion deformity on my right foot and my big toe is deviated against my second toe with inflammation and pain on my second toe along with large keratotic lesion that I cannot take care of and padding does not help   Review of Systems  All other systems reviewed and are negative.      Objective:   Physical Exam  Constitutional: She is oriented to person, place, and time.  Cardiovascular: Intact distal pulses.   Musculoskeletal: Normal range of motion.  Neurological: She is oriented to person, place, and time.  Skin: Skin is warm and dry.  Nursing note and vitals reviewed.  neurovascular status intact with muscle strength adequate and range of motion subtalar and midtarsal joint within normal limits. Patient's noted to have a large structural hyperostosis medial aspect first metatarsal head with deviation of the hallux against the second toe interphalangeal joint fluid buildup second toe and large keratotic lesion formation. Patient has good digital perfusion she's well oriented 3 and does have mild varicosities in the ankle noted     Assessment:     Structural HAV deformity along with interdigital inflammation of the second digit secondary to compression between the hallux and second toe with fluid buildup consistent with inflammatory capsulitis and lesion formation    Plan:     H&P and x-rays reviewed. Explained the patient what we could do surgically and all treatment options available. To try conservative treatment and today I did a careful interphalangeal joint medial injection 2 mg dexamethasone Kenalog 5 mg Xylocaine and then did full debridement of the lesion and applied padding area if this gives her good relief we'll continue this treatment and if not we will need to consider surgical options which were discussed with patient

## 2014-05-26 ENCOUNTER — Ambulatory Visit (INDEPENDENT_AMBULATORY_CARE_PROVIDER_SITE_OTHER): Payer: Medicare Other | Admitting: Critical Care Medicine

## 2014-05-26 ENCOUNTER — Encounter: Payer: Self-pay | Admitting: Critical Care Medicine

## 2014-05-26 VITALS — BP 142/76 | HR 80 | Temp 98.0°F | Ht 63.0 in | Wt 111.6 lb

## 2014-05-26 DIAGNOSIS — J449 Chronic obstructive pulmonary disease, unspecified: Secondary | ICD-10-CM

## 2014-05-26 NOTE — Patient Instructions (Signed)
No change in medications Follow up in 6 months with Dr. Joya Gaskins

## 2014-05-26 NOTE — Progress Notes (Signed)
Subjective:    Patient ID: [name], female    DOB: 1930/06/11, 81 y.o.   MRN: [id number]  HPI 05/26/2014 Chief Complaint  Patient presents with  . Follow-up    C/O non productive cough. Denies SOB, chest tightness,CP, productive cough, f/c/s, hemotypsis   Non prod cough, no mucus, notes frog in throat issue, notes some pndrip.  Min nasal drip and rhinorrhea Pt denies any significant sore throat, nasal congestion or excess secretions, fever, chills, sweats, unintended weight loss, pleurtic or exertional chest pain, orthopnea PND, or leg swelling Pt denies any increase in rescue therapy over baseline, denies waking up needing it or having any early am or nocturnal exacerbations of coughing/wheezing/or dyspnea. Pt also denies any obvious fluctuation in symptoms with  weather or environmental change or other alleviating or aggravating factors  Wt Readings from Last 3 Encounters:  05/26/14 111 lb 9.6 oz (50.621 kg)  01/04/14 116 lb 9.6 oz (52.889 kg)  07/31/13 114 lb 3.2 oz (51.801 kg)       Review of Systems Constitutional:   No  weight loss, night sweats,  Fevers, chills, fatigue, lassitude. HEENT:   No headaches,  Difficulty swallowing,  Tooth/dental problems,  Sore throat,                No sneezing, itching, ear ache, nasal congestion, post nasal drip,   CV:  No chest pain,  Orthopnea, PND, swelling in lower extremities, anasarca, dizziness, palpitations  GI  No heartburn, indigestion, abdominal pain, nausea, vomiting, diarrhea, change in bowel habits, loss of appetite  Resp: No shortness of breath with exertion or at rest.  No excess mucus, no productive cough,  No non-productive cough,  No coughing up of blood.  No change in color of mucus.  No wheezing.  No chest wall deformity  Skin: no rash or lesions.  GU: no dysuria, change in color of urine, no urgency or frequency.  No flank pain.  MS:  No joint pain or swelling.  No decreased range of motion.  No back  pain.  Psych:  No change in mood or affect. No depression or anxiety.  No memory loss.     Objective:   Physical Exam Filed Vitals:   05/26/14 0920  BP: 142/76  Pulse: 80  Temp: 98 F (36.7 C)  TempSrc: Oral  Height: 5\' 3"  (1.6 m)  Weight: 111 lb 9.6 oz (50.621 kg)  SpO2: 99%    Gen: Pleasant, well-nourished, in no distress,  normal affect  ENT: No lesions,  mouth clear,  oropharynx clear, no postnasal drip  Neck: No JVD, no TMG, no carotid bruits  Lungs: No use of accessory muscles, no dullness to percussion, clear without rales or rhonchi  Cardiovascular: RRR, heart sounds normal, no murmur or gallops, no peripheral edema  Abdomen: soft and NT, no HSM,  BS normal  Musculoskeletal: No deformities, no cyanosis or clubbing  Neuro: alert, non focal  Skin: Warm, no lesions or rashes  No results found.        Assessment & Plan:   COPD (chronic obstructive pulmonary disease) Moderate obstruction in lung function but stable off inhaled meds Plan Return 6 months No need for inhaled meds       Updated Medication List Outpatient Encounter Prescriptions as of 05/26/2014  Medication Sig  . cholecalciferol (VITAMIN D) 1000 UNITS tablet Take 1,000 Units by mouth daily.  . fluticasone (FLONASE) 50 MCG/ACT nasal spray Place 2 sprays into the nose daily as  needed.   Marland Kitchen LORazepam (ATIVAN) 0.5 MG tablet Take 0.5 mg by mouth at bedtime.   . Multiple Vitamin (MULTIVITAMIN) tablet Take 1 tablet by mouth daily.  Marland Kitchen olmesartan (BENICAR) 5 MG tablet Take 5 mg by mouth daily.  . Probiotic Product (PROBIOTIC DAILY PO) Take by mouth as needed.   . [DISCONTINUED] Cyanocobalamin (VITAMIN B-12 PO) Take 1,000 Units by mouth every other day.  . [DISCONTINUED] cholecalciferol (VITAMIN D) 400 UNITS TABS Take 400 Units by mouth daily.

## 2014-05-27 NOTE — Assessment & Plan Note (Signed)
Moderate obstruction in lung function but stable off inhaled meds Plan Return 6 months No need for inhaled meds

## 2014-08-05 ENCOUNTER — Other Ambulatory Visit: Payer: Self-pay | Admitting: Gastroenterology

## 2014-08-05 DIAGNOSIS — R198 Other specified symptoms and signs involving the digestive system and abdomen: Secondary | ICD-10-CM

## 2014-08-05 DIAGNOSIS — R1084 Generalized abdominal pain: Secondary | ICD-10-CM

## 2014-08-09 ENCOUNTER — Telehealth: Payer: Self-pay | Admitting: Critical Care Medicine

## 2014-08-09 NOTE — Telephone Encounter (Signed)
Spoke with pt-  Pt recently had abd problems.  Abd u/s ordered by PCP, Dr. Justin Mend.  Pt reports " a 3 X 2 cm something" was found in right kidney.  States Dr. Watt Climes has now ordered a CT abd/pelvis for further information.  This is scheduled for Wednesday through [place].  Pt and son feel it is "extremely important" PW be aware and involved.  Pt states once CT report is available, she will ask for it to be sent along with u/s results for PW to review.    Will send to PW per pt's request.

## 2014-08-09 NOTE — Telephone Encounter (Signed)
Noted, will track results

## 2014-08-11 ENCOUNTER — Ambulatory Visit
Admission: RE | Admit: 2014-08-11 | Discharge: 2014-08-11 | Disposition: A | Discharge status: Home / Self Care | Payer: Medicare Other | Source: Ambulatory Visit | Attending: Gastroenterology | Admitting: Gastroenterology

## 2014-08-11 DIAGNOSIS — R1084 Generalized abdominal pain: Secondary | ICD-10-CM

## 2014-08-11 DIAGNOSIS — R198 Other specified symptoms and signs involving the digestive system and abdomen: Secondary | ICD-10-CM

## 2014-08-11 MED ORDER — IOPAMIDOL (ISOVUE-300) INJECTION 61%
100.0000 mL | Freq: Once | INTRAVENOUS | Status: AC | PRN
  Administered 2014-08-11: 100 mL via INTRAVENOUS

## 2014-08-13 NOTE — Telephone Encounter (Signed)
I spoke to the patient and advised her to follow up with the Urology appointment the patient verbalized understanding and no further questions

## 2014-08-13 NOTE — Telephone Encounter (Signed)
CT abdomen/pelvis ordered by Dr. Watt Climes is available in [place].

## 2014-08-13 NOTE — Telephone Encounter (Signed)
Crystal please advise if you have seen these results? Thanks.

## 2014-08-13 NOTE — Telephone Encounter (Signed)
I have not seen these results.   Triage, can you please call for the U/S and the CT results?  Thank you.

## 2014-08-18 ENCOUNTER — Other Ambulatory Visit: Payer: Self-pay | Admitting: Urology

## 2014-08-18 NOTE — Patient Instructions (Signed)
Britania Shreeve Mcevoy  08/18/2014   Your procedure is scheduled on:    08/20/2014    Report to [facility] Main  Entrance and follow signs to               [place] at       [place].  Call this number if you have problems the morning of surgery [phone]   Remember: ONLY 1 PERSON MAY GO WITH YOU TO SHORT STAY TO GET  READY MORNING OF [place].  Do not eat food or drink liquids :After Midnight.     Take these medicines the morning of surgery with A SIP OF WATER:   Flonase if needed                                You may not have any metal on your body including hair pins and              piercings  Do not wear jewelry, make-up, lotions, powders or perfumes, deodorant             Do not wear nail polish.  Do not shave  48 hours prior to surgery.                 Do not bring valuables to the hospital. [place].  Contacts, dentures or bridgework may not be worn into surgery.      Patients discharged the day of surgery will not be allowed to drive home.  Name and phone number of your driver:  Special Instructions: coughing and deep breathing exercises, leg exercises               Please read over the following fact sheets you were given: _____________________________________________________________________             [facility] - Preparing for Surgery Before surgery, you can play an important role.  Because skin is not sterile, your skin needs to be as free of germs as possible.  You can reduce the number of germs on your skin by washing with CHG (chlorahexidine gluconate) soap before surgery.  CHG is an antiseptic cleaner which kills germs and bonds with the skin to continue killing germs even after washing. Please DO NOT use if you have an allergy to CHG or antibacterial soaps.  If your skin becomes reddened/irritated stop using the CHG and inform your nurse when you arrive at Short Stay. Do  not shave (including legs and underarms) for at least 48 hours prior to the first CHG shower.  You may shave your face/neck. Please follow these instructions carefully:  1.  Shower with CHG Soap the night before surgery and the  morning of Surgery.  2.  If you choose to wash your hair, wash your hair first as usual with your  normal  shampoo.  3.  After you shampoo, rinse your hair and body thoroughly to remove the  shampoo.                           4.  Use CHG as you would any other liquid soap.  You can apply chg directly  to the skin and wash  Gently with a scrungie or clean washcloth.  5.  Apply the CHG Soap to your body ONLY FROM THE NECK DOWN.   Do not use on face/ open                           Wound or open sores. Avoid contact with eyes, ears mouth and genitals (private parts).                       Wash face,  Genitals (private parts) with your normal soap.             6.  Wash thoroughly, paying special attention to the area where your surgery  will be performed.  7.  Thoroughly rinse your body with warm water from the neck down.  8.  DO NOT shower/wash with your normal soap after using and rinsing off  the CHG Soap.                9.  Pat yourself dry with a clean towel.            10.  Wear clean pajamas.            11.  Place clean sheets on your bed the night of your first shower and do not  sleep with pets. Day of Surgery : Do not apply any lotions/deodorants the morning of surgery.  Please wear clean clothes to the hospital/surgery center.  FAILURE TO FOLLOW THESE INSTRUCTIONS MAY RESULT IN THE CANCELLATION OF YOUR SURGERY PATIENT SIGNATURE_________________________________  NURSE SIGNATURE__________________________________  ________________________________________________________________________

## 2014-08-18 NOTE — Progress Notes (Signed)
Called for release of orders in Epic to sign and held surgery 08-20-14 pre op 08-19-14 Thanks

## 2014-08-19 ENCOUNTER — Encounter (HOSPITAL_COMMUNITY)
Admission: RE | Admit: 2014-08-19 | Discharge: 2014-08-19 | Disposition: A | Discharge status: Home / Self Care | Payer: Medicare Other | Source: Ambulatory Visit | Attending: Urology | Admitting: Urology

## 2014-08-19 ENCOUNTER — Encounter (HOSPITAL_COMMUNITY): Payer: Self-pay | Admitting: Anesthesiology

## 2014-08-19 ENCOUNTER — Encounter (HOSPITAL_COMMUNITY): Payer: Self-pay

## 2014-08-19 DIAGNOSIS — Z885 Allergy status to narcotic agent status: Secondary | ICD-10-CM | POA: Diagnosis not present

## 2014-08-19 DIAGNOSIS — I10 Essential (primary) hypertension: Secondary | ICD-10-CM | POA: Diagnosis not present

## 2014-08-19 DIAGNOSIS — K219 Gastro-esophageal reflux disease without esophagitis: Secondary | ICD-10-CM | POA: Diagnosis not present

## 2014-08-19 DIAGNOSIS — M1990 Unspecified osteoarthritis, unspecified site: Secondary | ICD-10-CM | POA: Diagnosis not present

## 2014-08-19 DIAGNOSIS — J449 Chronic obstructive pulmonary disease, unspecified: Secondary | ICD-10-CM | POA: Diagnosis not present

## 2014-08-19 DIAGNOSIS — N2889 Other specified disorders of kidney and ureter: Secondary | ICD-10-CM | POA: Diagnosis present

## 2014-08-19 HISTORY — DX: Claustrophobia: F40.240

## 2014-08-19 HISTORY — DX: Unspecified osteoarthritis, unspecified site: M19.90

## 2014-08-19 LAB — CBC
HCT: 40.8 % (ref 36.0–46.0)
Hemoglobin: 13.1 g/dL (ref 12.0–15.0)
MCH: 31.2 pg (ref 26.0–34.0)
MCHC: 32.1 g/dL (ref 30.0–36.0)
MCV: 97.1 fL (ref 78.0–100.0)
Platelets: 274 10*3/uL (ref 150–400)
RBC: 4.2 MIL/uL (ref 3.87–5.11)
RDW: 12.8 % (ref 11.5–15.5)
WBC: 4.9 10*3/uL (ref 4.0–10.5)

## 2014-08-19 LAB — BASIC METABOLIC PANEL
Anion gap: 9 (ref 5–15)
BUN: 15 mg/dL (ref 6–20)
CO2: 29 mmol/L (ref 22–32)
Calcium: 9.9 mg/dL (ref 8.9–10.3)
Chloride: 99 mmol/L — ABNORMAL LOW (ref 101–111)
Creatinine, Ser: 0.79 mg/dL (ref 0.44–1.00)
GFR calc Af Amer: >60 mL/min (ref >60)
GFR calc non Af Amer: >60 mL/min (ref >60)
GLUCOSE: 105 mg/dL — AB (ref 65–99)
POTASSIUM: 6 mmol/L — AB (ref 3.5–5.1)
Sodium: 137 mmol/L (ref 135–145)

## 2014-08-19 MED ORDER — GENTAMICIN SULFATE 40 MG/ML IJ SOLN
5.0000 mg/kg | INTRAVENOUS | Status: AC
  Administered 2014-08-20: 250 mg via INTRAVENOUS
  Filled 2014-08-19: qty 6.25

## 2014-08-19 NOTE — Anesthesia Preprocedure Evaluation (Addendum)
Anesthesia Evaluation  Patient identified by MRN, date of birth, ID band Patient awake    Reviewed: Allergy & Precautions, NPO status , Patient's Chart, lab work & pertinent test results  Airway Mallampati: II  TM Distance: >3 FB Neck ROM: Full    Dental no notable dental hx.    Pulmonary pneumonia -, resolved, COPD H/O BOOP. H/O pulmonary fibrosis.  Has been doing well lately. Saw Dr. Joya Gaskins in March and he is satisfied with her status. breath sounds clear to auscultation  Pulmonary exam normal       Cardiovascular hypertension, Pt. on medications Normal cardiovascular examRhythm:Regular Rate:Normal  Stress ECHO 2012: Normal. EF 60%   Neuro/Psych negative neurological ROS  negative psych ROS   GI/Hepatic Neg liver ROS, GERD-  ,  Endo/Other  negative endocrine ROS  Renal/GU negative Renal ROS  negative genitourinary   Musculoskeletal  (+) Arthritis -,   Abdominal   Peds negative pediatric ROS (+)  Hematology negative hematology ROS (+)   Anesthesia Other Findings   Reproductive/Obstetrics negative OB ROS                          Anesthesia Physical Anesthesia Plan  ASA: III  Anesthesia Plan: General   Post-op Pain Management:    Induction: Intravenous  Airway Management Planned: LMA  Additional Equipment:   Intra-op Plan:   Post-operative Plan: Extubation in OR  Informed Consent: I have reviewed the patients History and Physical, chart, labs and discussed the procedure including the risks, benefits and alternatives for the proposed anesthesia with the patient or authorized representative who has indicated his/her understanding and acceptance.   Dental advisory given  Plan Discussed with: CRNA  Anesthesia Plan Comments: (? Increased risk for pulmonary complications.)       Anesthesia Quick Evaluation

## 2014-08-19 NOTE — Progress Notes (Addendum)
Called Dr Zettie Pho office and pressed 6 for Triage.  Dayton Scrape, CMA answered. Made her aware potassium 6.0 on patient  Done 08/19/14.   Have faxed labs to inbasket of Dr Tresa Moore.  Surgery on 08/20/2014.  Dayton Scrape, CMA to let Dr Tresa Moore be aware of abnormal lab results.  Faxed labs to Dayton Scrape, CMa at [phone] with fax confirmation.  Labs included were CBC and BMP.  Dayton Scrape, CMA called back and made me aware that Dr Tresa Moore aware of lab results done 08/19/14 ( potassium of 6.0 )  She stated Dr Tresa Moore stated that potassium was 4.7 in their lab on 08/18/2014.  No further orders given.

## 2014-08-20 ENCOUNTER — Ambulatory Visit (HOSPITAL_COMMUNITY)
Admission: RE | Admit: 2014-08-20 | Discharge: 2014-08-20 | Disposition: A | Discharge status: Home / Self Care | Payer: Medicare Other | Source: Ambulatory Visit | Attending: Urology | Admitting: Urology

## 2014-08-20 ENCOUNTER — Encounter (HOSPITAL_COMMUNITY)
Admission: RE | Disposition: A | Discharge status: Home / Self Care | Payer: Self-pay | Source: Ambulatory Visit | Attending: Urology

## 2014-08-20 ENCOUNTER — Ambulatory Visit (HOSPITAL_COMMUNITY): Payer: Medicare Other | Admitting: Anesthesiology

## 2014-08-20 ENCOUNTER — Encounter (HOSPITAL_COMMUNITY): Payer: Self-pay | Admitting: *Deleted

## 2014-08-20 DIAGNOSIS — M1990 Unspecified osteoarthritis, unspecified site: Secondary | ICD-10-CM | POA: Insufficient documentation

## 2014-08-20 DIAGNOSIS — Z885 Allergy status to narcotic agent status: Secondary | ICD-10-CM | POA: Insufficient documentation

## 2014-08-20 DIAGNOSIS — I10 Essential (primary) hypertension: Secondary | ICD-10-CM | POA: Diagnosis not present

## 2014-08-20 DIAGNOSIS — J449 Chronic obstructive pulmonary disease, unspecified: Secondary | ICD-10-CM | POA: Insufficient documentation

## 2014-08-20 DIAGNOSIS — K219 Gastro-esophageal reflux disease without esophagitis: Secondary | ICD-10-CM | POA: Diagnosis not present

## 2014-08-20 DIAGNOSIS — N2889 Other specified disorders of kidney and ureter: Secondary | ICD-10-CM | POA: Insufficient documentation

## 2014-08-20 HISTORY — PX: CYSTOSCOPY WITH RETROGRADE PYELOGRAM, URETEROSCOPY AND STENT PLACEMENT: SHX5789

## 2014-08-20 SURGERY — CYSTOURETEROSCOPY, WITH RETROGRADE PYELOGRAM AND STENT INSERTION
Anesthesia: General | Site: Ureter | Laterality: Right

## 2014-08-20 MED ORDER — FENTANYL CITRATE (PF) 100 MCG/2ML IJ SOLN: 25.0000 ug | INTRAMUSCULAR | Status: DC | PRN

## 2014-08-20 MED ORDER — TRAMADOL HCL 50 MG PO TABS
50.0000 mg | ORAL_TABLET | Freq: Four times a day (QID) | ORAL | Status: DC
  Administered 2014-08-20: 50 mg via ORAL
  Filled 2014-08-20: qty 1

## 2014-08-20 MED ORDER — LIDOCAINE HCL 1 % IJ SOLN
INTRAMUSCULAR | Status: DC | PRN
Start: 2014-08-20 — End: 2014-08-20
  Administered 2014-08-20: 50 mg via INTRADERMAL

## 2014-08-20 MED ORDER — LACTATED RINGERS IV SOLN
INTRAVENOUS | Status: DC | PRN
  Administered 2014-08-20: 07:00:00 via INTRAVENOUS

## 2014-08-20 MED ORDER — METOCLOPRAMIDE HCL 5 MG/ML IJ SOLN
INTRAMUSCULAR | Status: DC | PRN
  Administered 2014-08-20: 5 mg via INTRAVENOUS

## 2014-08-20 MED ORDER — PROPOFOL 10 MG/ML IV BOLUS
INTRAVENOUS | Status: DC | PRN
  Administered 2014-08-20: 100 mg via INTRAVENOUS

## 2014-08-20 MED ORDER — ONDANSETRON HCL 4 MG/2ML IJ SOLN
INTRAMUSCULAR | Status: DC | PRN
  Administered 2014-08-20: 4 mg via INTRAVENOUS

## 2014-08-20 MED ORDER — CEPHALEXIN 500 MG PO CAPS: 500.0000 mg | ORAL_CAPSULE | Freq: Two times a day (BID) | ORAL | Status: DC

## 2014-08-20 MED ORDER — DEXAMETHASONE SODIUM PHOSPHATE 10 MG/ML IJ SOLN
INTRAMUSCULAR | Status: DC | PRN
  Administered 2014-08-20: 5 mg via INTRAVENOUS

## 2014-08-20 MED ORDER — SENNOSIDES-DOCUSATE SODIUM 8.6-50 MG PO TABS
1.0000 | ORAL_TABLET | Freq: Two times a day (BID) | ORAL | Status: DC
Start: 2014-08-20 — End: 2014-09-28

## 2014-08-20 MED ORDER — FENTANYL CITRATE (PF) 100 MCG/2ML IJ SOLN
INTRAMUSCULAR | Status: DC | PRN
  Administered 2014-08-20 (×4): 25 ug via INTRAVENOUS

## 2014-08-20 MED ORDER — MIDAZOLAM HCL 2 MG/2ML IJ SOLN
INTRAMUSCULAR | Status: AC
  Filled 2014-08-20: qty 2

## 2014-08-20 MED ORDER — TRAMADOL HCL 50 MG PO TABS: 50.0000 mg | ORAL_TABLET | Freq: Four times a day (QID) | ORAL | Status: DC | PRN

## 2014-08-20 MED ORDER — CEFAZOLIN SODIUM-DEXTROSE 2-3 GM-% IV SOLR
INTRAVENOUS | Status: DC | PRN
  Administered 2014-08-20: 2 g via INTRAVENOUS

## 2014-08-20 MED ORDER — FENTANYL CITRATE (PF) 100 MCG/2ML IJ SOLN
INTRAMUSCULAR | Status: AC
  Filled 2014-08-20: qty 2

## 2014-08-20 MED ORDER — IOHEXOL 300 MG/ML  SOLN
INTRAMUSCULAR | Status: DC | PRN
  Administered 2014-08-20: 20 mL via INTRAVENOUS

## 2014-08-20 MED ORDER — SODIUM CHLORIDE 0.9 % IR SOLN
Status: DC | PRN
  Administered 2014-08-20: 3000 mL via INTRAVESICAL

## 2014-08-20 MED ORDER — PROPOFOL 10 MG/ML IV BOLUS
INTRAVENOUS | Status: AC
  Filled 2014-08-20: qty 20

## 2014-08-20 MED ORDER — MIDAZOLAM HCL 5 MG/5ML IJ SOLN
INTRAMUSCULAR | Status: DC | PRN
  Administered 2014-08-20 (×2): 0.5 mg via INTRAVENOUS

## 2014-08-20 MED ORDER — CEFAZOLIN SODIUM-DEXTROSE 2-3 GM-% IV SOLR
INTRAVENOUS | Status: AC
  Filled 2014-08-20: qty 50

## 2014-08-20 MED ORDER — LIDOCAINE HCL (CARDIAC) 20 MG/ML IV SOLN
INTRAVENOUS | Status: AC
  Filled 2014-08-20: qty 5

## 2014-08-20 SURGICAL SUPPLY — 32 items
BASKET LASER NITINOL 1.9FR (BASKET) IMPLANT
BASKET STNLS GEMINI 4WIRE 3FR (BASKET) IMPLANT
BASKET ZERO TIP NITINOL 2.4FR (BASKET) IMPLANT
BSKT STON RTRVL 120 1.9FR (BASKET)
BSKT STON RTRVL GEM 120X11 3FR (BASKET)
BSKT STON RTRVL ZERO TP 2.4FR (BASKET)
CATH INTERMIT  6FR 70CM (CATHETERS) ×3 IMPLANT
CLOTH BEACON ORANGE TIMEOUT ST (SAFETY) ×3 IMPLANT
ELECT REM PT RETURN 9FT ADLT (ELECTROSURGICAL)
ELECTRODE REM PT RTRN 9FT ADLT (ELECTROSURGICAL) IMPLANT
FIBER LASER FLEXIVA 1000 (UROLOGICAL SUPPLIES) IMPLANT
FIBER LASER FLEXIVA 200 (UROLOGICAL SUPPLIES) IMPLANT
FIBER LASER FLEXIVA 365 (UROLOGICAL SUPPLIES) IMPLANT
FIBER LASER FLEXIVA 550 (UROLOGICAL SUPPLIES) IMPLANT
FIBER LASER TRAC TIP (UROLOGICAL SUPPLIES) IMPLANT
GLOVE BIOGEL M STRL SZ7.5 (GLOVE) ×3 IMPLANT
GLOVE BIOGEL PI IND STRL 6.5 (GLOVE) IMPLANT
GLOVE BIOGEL PI INDICATOR 6.5 (GLOVE) ×4
GOWN STRL REUS W/ TWL LRG LVL4 (GOWN DISPOSABLE) IMPLANT
GOWN STRL REUS W/TWL LRG LVL4 (GOWN DISPOSABLE) ×3
GOWN STRL REUS W/TWL XL LVL3 (GOWN DISPOSABLE) ×6 IMPLANT
GUIDEWIRE ANG ZIPWIRE 038X150 (WIRE) ×3 IMPLANT
GUIDEWIRE STR DUAL SENSOR (WIRE) ×3 IMPLANT
IV NS 1000ML (IV SOLUTION) ×3
IV NS 1000ML BAXH (IV SOLUTION) IMPLANT
IV NS IRRIG 3000ML ARTHROMATIC (IV SOLUTION) ×5 IMPLANT
NS IRRIG 1000ML POUR BTL (IV SOLUTION) ×2 IMPLANT
PACK CYSTO (CUSTOM PROCEDURE TRAY) ×3 IMPLANT
STENT POLARIS 5FRX22 (STENTS) ×2 IMPLANT
SYRINGE 10CC LL (SYRINGE) IMPLANT
SYRINGE IRR TOOMEY STRL 70CC (SYRINGE) IMPLANT
TUBE FEEDING 8FR 16IN STR KANG (MISCELLANEOUS) ×3 IMPLANT

## 2014-08-20 NOTE — Anesthesia Postprocedure Evaluation (Signed)
  Anesthesia Post-op Note  Patient: [name]  Procedure(s) Performed: Procedure(s) (LRB): CYSTOSCOPY WITH RIGHT RETROGRADE PYELOGRAM, RIGHT DIAGNOSTIC URETEROSCOPY, RIGHT URETERAL STENT PLACEMENT (Right)  Patient Location: PACU  Anesthesia Type: General  Level of Consciousness: awake and alert   Airway and Oxygen Therapy: Patient Spontanous Breathing  Post-op Pain: mild  Post-op Assessment: Post-op Vital signs reviewed, Patient's Cardiovascular Status Stable, Respiratory Function Stable, Patent Airway and No signs of Nausea or vomiting  Last Vitals:  Filed Vitals:   08/20/14 0830  BP: 133/67  Pulse: 63  Temp:   Resp: 21    Post-op Vital Signs: stable   Complications: No apparent anesthesia complications. No dyspnea. Feels fine.

## 2014-08-20 NOTE — Discharge Instructions (Signed)
1 - You may have urinary urgency (bladder spasms) and bloody urine on / off with stent in place. This is normal.  2 - Call MD or go to ER for fever >102, severe pain / nausea / vomiting not relieved by medications, or acute change in medical status  3 - Remove tethered stent on Tuesday morning by pulling on string, then blue/white plastic tubing, and discarding. Dr. Tresa Moore is in the office Tuesday if any issues arise.

## 2014-08-20 NOTE — Transfer of Care (Signed)
Immediate Anesthesia Transfer of Care Note  Patient: [name]  Procedure(s) Performed: Procedure(s): CYSTOSCOPY WITH RIGHT RETROGRADE PYELOGRAM, RIGHT DIAGNOSTIC URETEROSCOPY, RIGHT URETERAL STENT PLACEMENT (Right)  Patient Location: PACU  Anesthesia Type:General  Level of Consciousness: awake, alert , oriented and patient cooperative  Airway & Oxygen Therapy: Patient Spontanous Breathing and Patient connected to nasal cannula oxygen  Post-op Assessment: Report given to RN and Post -op Vital signs reviewed and stable  Post vital signs: Reviewed and stable  Last Vitals:  Filed Vitals:   08/20/14 0815  BP: 137/85  Pulse: 70  Temp: 36.4 C  Resp: 13    Complications: No apparent anesthesia complications

## 2014-08-20 NOTE — Anesthesia Procedure Notes (Signed)
Procedure Name: LMA Insertion Date/Time: 08/20/2014 7:33 AM Performed by: Sherian Maroon A Pre-anesthesia Checklist: Patient identified, Emergency Drugs available, Suction available, Patient being monitored and Timeout performed Patient Re-evaluated:Patient Re-evaluated prior to inductionOxygen Delivery Method: Circle system utilized Preoxygenation: Pre-oxygenation with 100% oxygen Intubation Type: IV induction Ventilation: Mask ventilation without difficulty LMA: LMA inserted LMA Size: 3.0 Number of attempts: 1 Tube secured with: Tape Dental Injury: Teeth and Oropharynx as per pre-operative assessment

## 2014-08-20 NOTE — H&P (Signed)
[name] is an 81 y.o. female.    Chief Complaint: Pre-Op Right Diagnostic Ureteroscopy / Possible Biopsy for Right Renal Mass  HPI:   1 - Right Renal Mass - 3.5cm 100% endophytic solid enhancing right renal mass incidental by CT 07/2014 on eval vague abdominal pain and some functional GI complaints. No contralateral lesions. Mass does abut collecting system with some lower pole intrarenal hydro. Favor renal cell carcinoma by morphology but transitional cell lesion also in differential. NO gross hematuria. No recent CXR or CMP. Appear 1 artery,  1 tortuous vein renovascular anatomy.  PMH sig for appy, lap chole, fHTN. 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 Her PCP is Haley Small MD.  Today " [name] " is seen to proceed with cysto and diagnostic right ureteroscopy to rule out urothelial neoplasm before definitive management of her right renal mass. No interval fevers. Pre-op K yesterday 6.0, but has been <5 on all recent prior labs, favor hemolysis.   Past Medical History  Diagnosis Date  . Hypertension   . Pulmonary nodules   . BOOP (bronchiolitis obliterans with organizing pneumonia)   . Claustrophobia   . Arthritis     Past Surgical History  Procedure Laterality Date  . Tonsillectomy    . Appendectomy    . Cholecystectomy    . Hip surgery    . Meniscus tear surgery in right knee     . Cataract surgery       bilateral     Family History  Problem Relation Age of Onset  . Hypertension Mother   . Cancer Mother   . Heart failure Father   . Prostate cancer Brother    Social History:  reports that she has never smoked. She has never used smokeless tobacco. She reports that she drinks alcohol. She reports that she does not use illicit drugs.  Allergies:  Allergies  Allergen Reactions  . Codeine     Hallucinations     Medications Prior to Admission  Medication Sig Dispense Refill  . cholecalciferol (VITAMIN D) 1000 UNITS  tablet Take 1,000 Units by mouth daily.    . fluticasone (FLONASE) 50 MCG/ACT nasal spray Place 2 sprays into the nose daily as needed for allergies or rhinitis.     Marland Kitchen LORazepam (ATIVAN) 0.5 MG tablet Take 0.5 mg by mouth at bedtime.     . Multiple Vitamin (MULTIVITAMIN) tablet Take 1 tablet by mouth daily.    . naphazoline-glycerin (CLEAR EYES) 0.012-0.2 % SOLN Place 1-2 drops into both eyes every 4 (four) hours as needed for irritation.    . naproxen sodium (ANAPROX) 220 MG tablet Take 220 mg by mouth 2 (two) times daily as needed (Pain).    Marland Kitchen olmesartan (BENICAR) 5 MG tablet Take 5 mg by mouth every morning.     [name] Apply 1 application to eye at bedtime.      Results for orders placed or performed during the hospital encounter of 08/19/14 (from the past 48 hour(s))  CBC     Status: None   Collection Time: 08/19/14 11:15 AM  Result Value Ref Range   WBC 4.9 4.0 - 10.5 K/uL   RBC 4.20 3.87 - 5.11 MIL/uL   Hemoglobin 13.1 12.0 - 15.0 g/dL   HCT 40.8 36.0 - 46.0 %   MCV 97.1 78.0 - 100.0 fL   MCH 31.2 26.0 - 34.0 pg   MCHC 32.1 30.0 -  36.0 g/dL   RDW 12.8 11.5 - 15.5 %   Platelets 274 150 - 400 K/uL  Basic metabolic panel     Status: Abnormal   Collection Time: 08/19/14 11:15 AM  Result Value Ref Range   Sodium 137 135 - 145 mmol/L   Potassium 6.0 (H) 3.5 - 5.1 mmol/L   Chloride 99 (L) 101 - 111 mmol/L   CO2 29 22 - 32 mmol/L   Glucose, Bld 105 (H) 65 - 99 mg/dL   BUN 15 6 - 20 mg/dL   Creatinine, Ser 0.79 0.44 - 1.00 mg/dL   Calcium 9.9 8.9 - 10.3 mg/dL   GFR calc non Af Amer >60 >60 mL/min   GFR calc Af Amer >60 >60 mL/min    Comment: (NOTE) The eGFR has been calculated using the CKD EPI equation. This calculation has not been validated in all clinical situations. eGFR's persistently <60 mL/min signify possible Chronic Kidney Disease.    Anion gap 9 5 - 15   No results found.  Review of Systems  Constitutional: Negative.    HENT: Negative.   Eyes: Negative.   Respiratory: Negative.   Cardiovascular: Negative.   Gastrointestinal: Negative.   Genitourinary: Negative.   Musculoskeletal: Negative.   Skin: Negative.   Neurological: Negative.   Endo/Heme/Allergies: Negative.   Psychiatric/Behavioral: Negative.     Blood pressure 141/82, pulse 82, temperature 97.4 F (36.3 C), temperature source Oral, resp. rate 16, height 5' 3.5" (1.613 m), weight 50.349 kg (111 lb), SpO2 99 %. Physical Exam  Constitutional: She appears well-developed.  HENT:  Head: Normocephalic.  Eyes: Pupils are equal, round, and reactive to light.  Neck: Normal range of motion.  Cardiovascular: Normal rate.   Respiratory: Effort normal.  GI: Soft.  Genitourinary:  No CVAT  Musculoskeletal: Normal range of motion.  Neurological: She is alert.  Skin: Skin is warm.  Psychiatric: She has a normal mood and affect. Her behavior is normal. Judgment and thought content normal.     Assessment/Plan   1 - Right Renal Mass - highly worrisome for primary renal malignancy, renal cell v. transional cell. Lesion not amenable to nephron sparing. WIll need right radical nephrectomy v. nephroureterectomy if transitional cell. Rec cysto right retrograde and diagnostic ureteroscopy / possible biopsy  to rule out transitional cell lesion and final operative planning pending those findings.   Risks, benefits, Alternatives discussed. Proceed today as planned.    [name] 08/20/2014, 6:25 AM    

## 2014-08-20 NOTE — Op Note (Signed)
NAMEBURLENE, [name]          ACCOUNT NO.:  [id number]  MEDICAL RECORD NO.:  [id number]  LOCATION:  [facility]                         FACILITY:  [facility]  PHYSICIAN:  Alexis Frock, MD     DATE OF BIRTH:  May 28, 1930  DATE OF PROCEDURE:  08/20/2014                              OPERATIVE REPORT  DIAGNOSIS:  Endophytic right renal mass.  PROCEDURE: 1. Cystoscopy with right retrograde pyelogram and interpretation. 2. Right diagnostic ureteroscopy. 3. Insertion of right ureteral stent 5 x 22 Polaris with tether.  ESTIMATED BLOOD LOSS:  Nil.  COMPLICATIONS:  None.  SPECIMEN:  None.  FINDINGS: 1. Unremarkable urinary bladder. 2. Right retrograde pyelogram with apparent pushing of extrinsic mass     on the collecting system. 3. Right diagnostic ureteroscopy and pyeloscopy without intraluminal     lesion whatsoever.  There was also felt to be pushing effect on the     collecting system, but no direct invasion or papillary lesion.  Photo documentation performed. 4. Possible placement of right ureteral stent.  Upper and mid pole     calyx distal in urinary bladder.  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 There appeared to be no obvious distant disease.  Given the incredibly endophytic nature as well as distortion of the intrarenal anatomy, it was unclear whether this represented the urothelial malignancy versus a cortical malignancy as this difference would drastically alter her management.  It was felt that diagnostic ureteroscopy was warranted with the goal of ruling out urothelial lesions.  Informed consent was obtained and placed in medical record.  DESCRIPTION OF PROCEDURE:  The patient being [name], procedure being right diagnostic ureteroscopy with possible stenting and possible biopsy was confirmed.   Procedure was carried out.  Time-out was performed.  Intravenous antibiotics were administered.  General LMA anesthesia was introduced. Patient was placed into a low lithotomy position.  Sterile field was created by prepping and draping the patient's vagina, introitus, and proximal thighs using iodine x3.  Next, cystourethroscopy was performed using a 23-French cystoscope, 30-degree offset lens, inspection of the bladder revealed no diverticula, calcifications, papillary lesions.  Ureteral orifices were singleton bilaterally.  The right ureteral orifice was cannulated with 6-French angled catheter and right retrograde pyelogram was obtained.  Right retrograde pyelogram demonstrated a single right ureter, single system right kidney.  There was some distortion of the intrarenal anatomy with likely extrinsic pushing from likely extrinsic mass.  A 0.038 zip wire was advanced to the level of the mid pole and semi-rigid ureteroscopy was performed of the entire length of the right ureter alongside a separate Sensor working wire.  No mucosal abnormalities were found.  The semi-rigid ureteroscope was exchanged for a dual lumen digital ureteroscope to the level of the kidney.  Flexible pyeloscopy was performed inspecting all accessible calices x2.  Again, there was a pushing  extrinsic effect on the intrarenal anatomy, but no intraluminal lesions whatsoever for the documentation was performed.  It was felt that this overall picture was most clearly consistent with nonurothelial origin to the patient's right renal mass.  Given the somewhat tortuous intrarenal anatomy, it was felt that interval ureteral stenting would be warranted.  The flexible scope was removed under continuous fluoroscopic vision, no mucosal abnormalities were found.  Finally, a new 5 x 22 Polaris type stent was placed using fluoroscopic guidance.  Proximal and mid pole calyx just linear to urinary bladder.  Tether was left  in place.  Fashion to the pubis.  Bladder was emptied per cystoscope procedure and  terminated.  The patient tolerated the procedure well and there were no immediate periprocedural complications.  The patient was taken to postanesthesia care unit in stable condition.          ______________________________ Alexis Frock, MD     TM/MEDQ  D:  08/20/2014  T:  08/20/2014  Job:  [id number]

## 2014-08-20 NOTE — Brief Op Note (Signed)
08/20/2014  8:00 AM  PATIENT:  [name]  81 y.o. female  PRE-OPERATIVE DIAGNOSIS:  RIGHT RENAL MASS  POST-OPERATIVE DIAGNOSIS:  RIGHT RENAL MASS  PROCEDURE:  Procedure(s): CYSTOSCOPY WITH RIGHT RETROGRADE PYELOGRAM, RIGHT DIAGNOSTIC URETEROSCOPY, RIGHT URETERAL STENT PLACEMENT (Right)  SURGEON:  Surgeon(s) and Role:    * Alexis Frock, MD - Primary  PHYSICIAN ASSISTANT:   ASSISTANTS: none   ANESTHESIA:   general  EBL:     BLOOD ADMINISTERED:none  DRAINS: none   LOCAL MEDICATIONS USED:  NONE  SPECIMEN:  No Specimen  DISPOSITION OF SPECIMEN:  N/A  COUNTS:  YES  TOURNIQUET:  * No tourniquets in log *  DICTATION: .Other Dictation: Dictation Number [id number]  PLAN OF CARE: Discharge to home after PACU  PATIENT DISPOSITION:  PACU - hemodynamically stable.   Delay start of Pharmacological VTE agent (>24hrs) due to surgical blood loss or risk of bleeding: yes

## 2014-08-24 ENCOUNTER — Encounter (HOSPITAL_COMMUNITY): Payer: Self-pay | Admitting: Urology

## 2014-08-24 ENCOUNTER — Other Ambulatory Visit: Payer: Self-pay | Admitting: Urology

## 2014-09-20 ENCOUNTER — Other Ambulatory Visit: Payer: Self-pay

## 2014-09-28 ENCOUNTER — Encounter (HOSPITAL_COMMUNITY)
Admission: RE | Admit: 2014-09-28 | Discharge: 2014-09-28 | Disposition: A | Discharge status: Home / Self Care | Payer: Medicare Other | Source: Ambulatory Visit | Attending: Urology | Admitting: Urology

## 2014-09-28 ENCOUNTER — Encounter (HOSPITAL_COMMUNITY): Payer: Self-pay

## 2014-09-28 HISTORY — DX: Chronic kidney disease, unspecified: N18.9

## 2014-09-28 HISTORY — DX: Bronchitis, not specified as acute or chronic: J40

## 2014-09-28 HISTORY — DX: Cardiac arrhythmia, unspecified: I49.9

## 2014-09-28 LAB — CBC
HCT: 41.1 % (ref 36.0–46.0)
Hemoglobin: 13.4 g/dL (ref 12.0–15.0)
MCH: 31.5 pg (ref 26.0–34.0)
MCHC: 32.6 g/dL (ref 30.0–36.0)
MCV: 96.7 fL (ref 78.0–100.0)
PLATELETS: 264 10*3/uL (ref 150–400)
RBC: 4.25 MIL/uL (ref 3.87–5.11)
RDW: 12.9 % (ref 11.5–15.5)
WBC: 3.8 10*3/uL — ABNORMAL LOW (ref 4.0–10.5)

## 2014-09-28 LAB — BASIC METABOLIC PANEL
Anion gap: 9 (ref 5–15)
BUN: 15 mg/dL (ref 6–20)
CO2: 29 mmol/L (ref 22–32)
Calcium: 9.7 mg/dL (ref 8.9–10.3)
Chloride: 99 mmol/L — ABNORMAL LOW (ref 101–111)
Creatinine, Ser: 0.73 mg/dL (ref 0.44–1.00)
GFR calc Af Amer: >60 mL/min (ref >60)
GFR calc non Af Amer: >60 mL/min (ref >60)
Glucose, Bld: 92 mg/dL (ref 65–99)
POTASSIUM: 4.8 mmol/L (ref 3.5–5.1)
Sodium: 137 mmol/L (ref 135–145)

## 2014-09-28 NOTE — Patient Instructions (Signed)
[name]  09/28/2014   Your procedure is scheduled on: Friday October 01, 2014   Report to [facility] Main  Entrance take [place]  elevators to 3rd floor to  [place] at 10:00 AM.  Call this number if you have problems the morning of surgery [phone]   Remember: ONLY 1 PERSON MAY GO WITH YOU TO SHORT STAY TO GET  READY MORNING OF [place].  Do not eat food or drink liquids :After Midnight.             FOLLOW MD BOWEL PREP INSTRUCTIONS      Take these medicines the morning of surgery with A SIP OF WATER: Flonase spray if needed; eye gtts if needed (bring day of surgery)                               You may not have any metal on your body including hair pins and              piercings  Do not wear jewelry, make-up, lotions, powders or perfumes, deodorant             Do not wear nail polish.  Do not shave  48 hours prior to surgery.              Do not bring valuables to the hospital. [place].  Contacts, dentures or bridgework may not be worn into surgery.  Leave suitcase in the car. After surgery it may be brought to your room.     Special Instructions: DO NOT REMOVED BLUE BLOOD BAND WHEN PLACED TILL AFTER SURGERY               Please read over the following fact sheets you were given:Blood Transfusion Fact Sheet  _____________________________________________________________________             [facility] - Preparing for Surgery Before surgery, you can play an important role.  Because skin is not sterile, your skin needs to be as free of germs as possible.  You can reduce the number of germs on your skin by washing with CHG (chlorahexidine gluconate) soap before surgery.  CHG is an antiseptic cleaner which kills germs and bonds with the skin to continue killing germs even after washing. Please DO NOT use if you have an allergy to CHG or antibacterial soaps.  If your skin becomes  reddened/irritated stop using the CHG and inform your nurse when you arrive at Short Stay. Do not shave (including legs and underarms) for at least 48 hours prior to the first CHG shower.  You may shave your face/neck. Please follow these instructions carefully:  1.  Shower with CHG Soap the night before surgery and the  morning of Surgery.  2.  If you choose to wash your hair, wash your hair first as usual with your  normal  shampoo.  3.  After you shampoo, rinse your hair and body thoroughly to remove the  shampoo.                           4.  Use CHG as you would any other liquid soap.  You can apply chg directly  to  the skin and wash                       Gently with a scrungie or clean washcloth.  5.  Apply the CHG Soap to your body ONLY FROM THE NECK DOWN.   Do not use on face/ open                           Wound or open sores. Avoid contact with eyes, ears mouth and genitals (private parts).                       Wash face,  Genitals (private parts) with your normal soap.             6.  Wash thoroughly, paying special attention to the area where your surgery  will be performed.  7.  Thoroughly rinse your body with warm water from the neck down.  8.  DO NOT shower/wash with your normal soap after using and rinsing off  the CHG Soap.                9.  Pat yourself dry with a clean towel.            10.  Wear clean pajamas.            11.  Place clean sheets on your bed the night of your first shower and do not  sleep with pets. Day of Surgery : Do not apply any lotions/deodorants the morning of surgery.  Please wear clean clothes to the hospital/surgery center.  FAILURE TO FOLLOW THESE INSTRUCTIONS MAY RESULT IN THE CANCELLATION OF YOUR SURGERY PATIENT SIGNATURE_________________________________  NURSE SIGNATURE__________________________________  ________________________________________________________________________  WHAT IS A BLOOD TRANSFUSION? Blood Transfusion Information  A  transfusion is the replacement of blood or some of its parts. Blood is made up of multiple cells which provide different functions.  Red blood cells carry oxygen and are used for blood loss replacement.  White blood cells fight against infection.  Platelets control bleeding.  Plasma helps clot blood.  Other blood products are available for specialized needs, such as hemophilia or other clotting disorders. BEFORE THE TRANSFUSION  Who gives blood for transfusions?   Healthy volunteers who are fully evaluated to make sure their blood is safe. This is blood bank blood. Transfusion therapy is the safest it has ever been in the practice of medicine. Before blood is taken from a donor, a complete history is taken to make sure that person has no history of diseases nor engages in risky social behavior (examples are intravenous drug use or sexual activity with multiple partners). The donor's travel history is screened to minimize risk of transmitting infections, such as malaria. The donated blood is tested for signs of infectious diseases, such as HIV and hepatitis. The blood is then tested to be sure it is compatible with you in order to minimize the chance of a transfusion reaction. If you or a relative donates blood, this is often done in anticipation of surgery and is not appropriate for emergency situations. It takes many days to process the donated blood. RISKS AND COMPLICATIONS Although transfusion therapy is very safe and saves many lives, the main dangers of transfusion include:   Getting an infectious disease.  Developing a transfusion reaction. This is an allergic reaction to something in the blood you were given. Every precaution is taken to prevent this.  The decision to have a blood transfusion has been considered carefully by your caregiver before blood is given. Blood is not given unless the benefits outweigh the risks. AFTER THE TRANSFUSION  Right after receiving a blood transfusion,  you will usually feel much better and more energetic. This is especially true if your red blood cells have gotten low (anemic). The transfusion raises the level of the red blood cells which carry oxygen, and this usually causes an energy increase.  The nurse administering the transfusion will monitor you carefully for complications. HOME CARE INSTRUCTIONS  No special instructions are needed after a transfusion. You may find your energy is better. Speak with your caregiver about any limitations on activity for underlying diseases you may have. SEEK MEDICAL CARE IF:   Your condition is not improving after your transfusion.  You develop redness or irritation at the intravenous (IV) site. SEEK IMMEDIATE MEDICAL CARE IF:  Any of the following symptoms occur over the next 12 hours:  Shaking chills.  You have a temperature by mouth above 102 F (38.9 C), not controlled by medicine.  Chest, back, or muscle pain.  People around you feel you are not acting correctly or are confused.  Shortness of breath or difficulty breathing.  Dizziness and fainting.  You get a rash or develop hives.  You have a decrease in urine output.  Your urine turns a dark color or changes to pink, red, or brown. Any of the following symptoms occur over the next 10 days:  You have a temperature by mouth above 102 F (38.9 C), not controlled by medicine.  Shortness of breath.  Weakness after normal activity.  The white part of the eye turns yellow (jaundice).  You have a decrease in the amount of urine or are urinating less often.  Your urine turns a dark color or changes to pink, red, or brown. Document Released: 03/09/2000 Document Revised: 06/04/2011 Document Reviewed: 10/27/2007 [facility] Patient Information 2014 [place], [place].  _______________________________________________________________________

## 2014-09-28 NOTE — Progress Notes (Signed)
CT abd/pelvis w contrast in epic 08/11/2014 (lungs mentioned) EKG epic 08/19/2014 ECHO epic 07/03/2010  OV note per epic per Dr Cliffton Asters 05/26/2014

## 2014-09-30 NOTE — Anesthesia Preprocedure Evaluation (Addendum)
Anesthesia Evaluation  Patient identified by MRN, date of birth, ID band Patient awake    Reviewed: Allergy & Precautions, H&P , NPO status , Patient's Chart, lab work & pertinent test results  Airway Mallampati: I  TM Distance: >3 FB Neck ROM: Full    Dental no notable dental hx.    Pulmonary pneumonia -, resolved, COPD H/O BOOP. H/O pulmonary fibrosis.  Has been doing well lately. Saw Dr. Joya Gaskins in March and he is satisfied with her status. breath sounds clear to auscultation  Pulmonary exam normal       Cardiovascular hypertension, Pt. on medications Normal cardiovascular examRhythm:Regular Rate:Normal  Stress ECHO 2012: Normal. EF 60%   Neuro/Psych negative neurological ROS  negative psych ROS   GI/Hepatic Neg liver ROS, GERD-  ,  Endo/Other  negative endocrine ROS  Renal/GU Renal disease  negative genitourinary   Musculoskeletal  (+) Arthritis -,   Abdominal   Peds negative pediatric ROS (+)  Hematology negative hematology ROS (+)   Anesthesia Other Findings   Reproductive/Obstetrics negative OB ROS                          Anesthesia Physical  Anesthesia Plan  ASA: III  Anesthesia Plan: General ETT and General   Post-op Pain Management:    Induction: Intravenous  Airway Management Planned: Oral ETT  Additional Equipment:   Intra-op Plan:   Post-operative Plan: Extubation in OR  Informed Consent: I have reviewed the patients History and Physical, chart, labs and discussed the procedure including the risks, benefits and alternatives for the proposed anesthesia with the patient or authorized representative who has indicated his/her understanding and acceptance.   Dental advisory given  Plan Discussed with: CRNA  Anesthesia Plan Comments: (- COPD, pulmonary status is optimized per having discussions with patient and pulmonary notes (Dr. Joya Gaskins), did have candid  discussion about risk for needing prolonged respiratory support  - will start 2 PIVs)    Anesthesia Quick Evaluation

## 2014-10-01 ENCOUNTER — Inpatient Hospital Stay (HOSPITAL_COMMUNITY): Payer: Medicare Other | Admitting: Anesthesiology

## 2014-10-01 ENCOUNTER — Inpatient Hospital Stay (HOSPITAL_COMMUNITY)
Admission: RE | Admit: 2014-10-01 | Discharge: 2014-10-03 | DRG: 661 | Disposition: A | Discharge status: Home / Self Care | Payer: Medicare Other | Source: Ambulatory Visit | Attending: Urology | Admitting: Urology

## 2014-10-01 ENCOUNTER — Encounter (HOSPITAL_COMMUNITY): Payer: Self-pay | Admitting: Anesthesiology

## 2014-10-01 ENCOUNTER — Encounter (HOSPITAL_COMMUNITY)
Admission: RE | Disposition: A | Discharge status: Home / Self Care | Payer: Self-pay | Source: Ambulatory Visit | Attending: Urology

## 2014-10-01 DIAGNOSIS — J8489 Other specified interstitial pulmonary diseases: Secondary | ICD-10-CM | POA: Diagnosis present

## 2014-10-01 DIAGNOSIS — J449 Chronic obstructive pulmonary disease, unspecified: Secondary | ICD-10-CM | POA: Diagnosis present

## 2014-10-01 DIAGNOSIS — I129 Hypertensive chronic kidney disease with stage 1 through stage 4 chronic kidney disease, or unspecified chronic kidney disease: Secondary | ICD-10-CM | POA: Diagnosis present

## 2014-10-01 DIAGNOSIS — K5900 Constipation, unspecified: Secondary | ICD-10-CM | POA: Diagnosis not present

## 2014-10-01 DIAGNOSIS — R112 Nausea with vomiting, unspecified: Secondary | ICD-10-CM | POA: Diagnosis not present

## 2014-10-01 DIAGNOSIS — J8410 Pulmonary fibrosis, unspecified: Secondary | ICD-10-CM | POA: Diagnosis present

## 2014-10-01 DIAGNOSIS — Z885 Allergy status to narcotic agent status: Secondary | ICD-10-CM

## 2014-10-01 DIAGNOSIS — R918 Other nonspecific abnormal finding of lung field: Secondary | ICD-10-CM | POA: Diagnosis present

## 2014-10-01 DIAGNOSIS — N2889 Other specified disorders of kidney and ureter: Secondary | ICD-10-CM | POA: Diagnosis present

## 2014-10-01 DIAGNOSIS — K828 Other specified diseases of gallbladder: Secondary | ICD-10-CM | POA: Diagnosis present

## 2014-10-01 DIAGNOSIS — M1990 Unspecified osteoarthritis, unspecified site: Secondary | ICD-10-CM | POA: Diagnosis present

## 2014-10-01 DIAGNOSIS — N189 Chronic kidney disease, unspecified: Secondary | ICD-10-CM | POA: Diagnosis present

## 2014-10-01 DIAGNOSIS — R109 Unspecified abdominal pain: Secondary | ICD-10-CM | POA: Diagnosis present

## 2014-10-01 DIAGNOSIS — K660 Peritoneal adhesions (postprocedural) (postinfection): Secondary | ICD-10-CM | POA: Diagnosis present

## 2014-10-01 DIAGNOSIS — R160 Hepatomegaly, not elsewhere classified: Secondary | ICD-10-CM | POA: Diagnosis present

## 2014-10-01 DIAGNOSIS — F40240 Claustrophobia: Secondary | ICD-10-CM | POA: Diagnosis present

## 2014-10-01 DIAGNOSIS — D49519 Neoplasm of unspecified behavior of unspecified kidney: Secondary | ICD-10-CM | POA: Diagnosis present

## 2014-10-01 HISTORY — DX: Other specified postprocedural states: R11.2

## 2014-10-01 HISTORY — DX: Other specified postprocedural states: Z98.890

## 2014-10-01 HISTORY — PX: ROBOT ASSISTED LAPAROSCOPIC NEPHRECTOMY: SHX5140

## 2014-10-01 LAB — TYPE AND SCREEN
ABO/RH(D): O POS
Antibody Screen: NEGATIVE

## 2014-10-01 LAB — HEMOGLOBIN AND HEMATOCRIT, BLOOD
HCT: 39.4 % (ref 36.0–46.0)
Hemoglobin: 12.7 g/dL (ref 12.0–15.0)

## 2014-10-01 SURGERY — ROBOTIC ASSISTED LAPAROSCOPIC NEPHRECTOMY
Anesthesia: General | Laterality: Right

## 2014-10-01 MED ORDER — HYDROMORPHONE HCL 1 MG/ML IJ SOLN
INTRAMUSCULAR | Status: DC | PRN
  Administered 2014-10-01: 1 mg via INTRAVENOUS

## 2014-10-01 MED ORDER — PROPOFOL 10 MG/ML IV BOLUS
INTRAVENOUS | Status: AC
  Filled 2014-10-01: qty 20

## 2014-10-01 MED ORDER — FENTANYL CITRATE (PF) 100 MCG/2ML IJ SOLN
INTRAMUSCULAR | Status: DC | PRN
  Administered 2014-10-01 (×8): 50 ug via INTRAVENOUS

## 2014-10-01 MED ORDER — ONDANSETRON HCL 4 MG/2ML IJ SOLN
INTRAMUSCULAR | Status: AC
  Filled 2014-10-01: qty 2

## 2014-10-01 MED ORDER — NEOSTIGMINE METHYLSULFATE 10 MG/10ML IV SOLN
INTRAVENOUS | Status: AC
  Filled 2014-10-01: qty 1

## 2014-10-01 MED ORDER — MIDAZOLAM HCL 2 MG/2ML IJ SOLN
INTRAMUSCULAR | Status: AC
  Filled 2014-10-01: qty 2

## 2014-10-01 MED ORDER — CEFAZOLIN SODIUM-DEXTROSE 2-3 GM-% IV SOLR
INTRAVENOUS | Status: AC
Start: 2014-10-01 — End: 2014-10-01
  Filled 2014-10-01: qty 50

## 2014-10-01 MED ORDER — LACTATED RINGERS IV SOLN
INTRAVENOUS | Status: DC | PRN
  Administered 2014-10-01 (×3): via INTRAVENOUS

## 2014-10-01 MED ORDER — DOCUSATE SODIUM 100 MG PO CAPS: 100.0000 mg | ORAL_CAPSULE | Freq: Two times a day (BID) | ORAL | Status: DC

## 2014-10-01 MED ORDER — STERILE WATER FOR IRRIGATION IR SOLN
Status: DC | PRN
  Administered 2014-10-01: 1500 mL

## 2014-10-01 MED ORDER — ONDANSETRON HCL 4 MG/2ML IJ SOLN
4.0000 mg | INTRAMUSCULAR | Status: DC | PRN
  Administered 2014-10-01 – 2014-10-02 (×5): 4 mg via INTRAVENOUS
  Filled 2014-10-01 (×5): qty 2

## 2014-10-01 MED ORDER — ACETAMINOPHEN 500 MG PO TABS
1000.0000 mg | ORAL_TABLET | Freq: Four times a day (QID) | ORAL | Status: AC
  Administered 2014-10-01 – 2014-10-02 (×3): 1000 mg via ORAL
  Filled 2014-10-01 (×3): qty 2

## 2014-10-01 MED ORDER — ONDANSETRON HCL 4 MG/2ML IJ SOLN
INTRAMUSCULAR | Status: DC | PRN
  Administered 2014-10-01 (×2): 4 mg via INTRAVENOUS

## 2014-10-01 MED ORDER — DEXAMETHASONE SODIUM PHOSPHATE 10 MG/ML IJ SOLN
INTRAMUSCULAR | Status: AC
  Filled 2014-10-01: qty 1

## 2014-10-01 MED ORDER — FENTANYL CITRATE (PF) 100 MCG/2ML IJ SOLN: 25.0000 ug | INTRAMUSCULAR | Status: DC | PRN

## 2014-10-01 MED ORDER — GLYCOPYRROLATE 0.2 MG/ML IJ SOLN
INTRAMUSCULAR | Status: DC | PRN
  Administered 2014-10-01: 0.4 mg via INTRAVENOUS

## 2014-10-01 MED ORDER — METOCLOPRAMIDE HCL 5 MG/ML IJ SOLN
INTRAMUSCULAR | Status: AC
  Filled 2014-10-01: qty 2

## 2014-10-01 MED ORDER — ACETAMINOPHEN 10 MG/ML IV SOLN
1000.0000 mg | Freq: Once | INTRAVENOUS | Status: AC
  Administered 2014-10-01: 1000 mg via INTRAVENOUS

## 2014-10-01 MED ORDER — LACTATED RINGERS IV SOLN
INTRAVENOUS | Status: DC
  Administered 2014-10-01: 20:00:00 via INTRAVENOUS
  Administered 2014-10-01: 1000 mL via INTRAVENOUS

## 2014-10-01 MED ORDER — FENTANYL CITRATE (PF) 250 MCG/5ML IJ SOLN
INTRAMUSCULAR | Status: AC
  Filled 2014-10-01: qty 5

## 2014-10-01 MED ORDER — LACTATED RINGERS IV SOLN
INTRAVENOUS | Status: DC
  Administered 2014-10-01: 1000 mL via INTRAVENOUS
  Administered 2014-10-01: 11:00:00 via INTRAVENOUS

## 2014-10-01 MED ORDER — HYDRALAZINE HCL 20 MG/ML IJ SOLN
INTRAMUSCULAR | Status: DC | PRN
  Administered 2014-10-01: 5 mg via INTRAVENOUS

## 2014-10-01 MED ORDER — OXYCODONE HCL 5 MG PO TABS
5.0000 mg | ORAL_TABLET | ORAL | Status: DC | PRN
  Filled 2014-10-01: qty 1

## 2014-10-01 MED ORDER — SODIUM CHLORIDE 0.9 % IJ SOLN
INTRAMUSCULAR | Status: AC
  Filled 2014-10-01: qty 20

## 2014-10-01 MED ORDER — NEOSTIGMINE METHYLSULFATE 10 MG/10ML IV SOLN
INTRAVENOUS | Status: DC | PRN
  Administered 2014-10-01: 3 mg via INTRAVENOUS

## 2014-10-01 MED ORDER — CEFAZOLIN SODIUM-DEXTROSE 2-3 GM-% IV SOLR
2.0000 g | INTRAVENOUS | Status: AC
  Administered 2014-10-01: 2 g via INTRAVENOUS

## 2014-10-01 MED ORDER — LORAZEPAM 0.5 MG PO TABS
0.5000 mg | ORAL_TABLET | Freq: Every day | ORAL | Status: DC
  Administered 2014-10-01 – 2014-10-02 (×2): 0.5 mg via ORAL
  Filled 2014-10-01 (×2): qty 1

## 2014-10-01 MED ORDER — CISATRACURIUM BESYLATE (PF) 10 MG/5ML IV SOLN
INTRAVENOUS | Status: DC | PRN
  Administered 2014-10-01: 4 mg via INTRAVENOUS

## 2014-10-01 MED ORDER — LIDOCAINE HCL (CARDIAC) 20 MG/ML IV SOLN
INTRAVENOUS | Status: DC | PRN
  Administered 2014-10-01: 50 mg via INTRAVENOUS

## 2014-10-01 MED ORDER — LIDOCAINE HCL (CARDIAC) 20 MG/ML IV SOLN
INTRAVENOUS | Status: AC
  Filled 2014-10-01: qty 5

## 2014-10-01 MED ORDER — ACETAMINOPHEN 10 MG/ML IV SOLN
INTRAVENOUS | Status: AC
  Filled 2014-10-01: qty 100

## 2014-10-01 MED ORDER — DEXAMETHASONE SODIUM PHOSPHATE 10 MG/ML IJ SOLN
INTRAMUSCULAR | Status: DC | PRN
  Administered 2014-10-01: 10 mg via INTRAVENOUS

## 2014-10-01 MED ORDER — BUPIVACAINE LIPOSOME 1.3 % IJ SUSP
20.0000 mL | Freq: Once | INTRAMUSCULAR | Status: AC
  Administered 2014-10-01: 20 mL
  Filled 2014-10-01: qty 20

## 2014-10-01 MED ORDER — DEXTROSE-NACL 5-0.45 % IV SOLN: INTRAVENOUS | Status: DC

## 2014-10-01 MED ORDER — PROPOFOL 10 MG/ML IV BOLUS
INTRAVENOUS | Status: DC | PRN
  Administered 2014-10-01: 110 mg via INTRAVENOUS

## 2014-10-01 MED ORDER — METOCLOPRAMIDE HCL 5 MG/ML IJ SOLN
INTRAMUSCULAR | Status: DC | PRN
  Administered 2014-10-01: 10 mg via INTRAVENOUS

## 2014-10-01 MED ORDER — LACTATED RINGERS IR SOLN
Status: DC | PRN
  Administered 2014-10-01: 1

## 2014-10-01 MED ORDER — ROCURONIUM BROMIDE 100 MG/10ML IV SOLN
INTRAVENOUS | Status: DC | PRN
  Administered 2014-10-01: 10 mg via INTRAVENOUS
  Administered 2014-10-01: 40 mg via INTRAVENOUS

## 2014-10-01 MED ORDER — HYDRALAZINE HCL 20 MG/ML IJ SOLN
INTRAMUSCULAR | Status: AC
  Filled 2014-10-01: qty 1

## 2014-10-01 MED ORDER — HYDROMORPHONE HCL 1 MG/ML IJ SOLN
0.5000 mg | INTRAMUSCULAR | Status: DC | PRN
  Administered 2014-10-01: 1 mg via INTRAVENOUS
  Filled 2014-10-01: qty 1

## 2014-10-01 MED ORDER — GLYCOPYRROLATE 0.2 MG/ML IJ SOLN
INTRAMUSCULAR | Status: AC
  Filled 2014-10-01: qty 2

## 2014-10-01 MED ORDER — MIDAZOLAM HCL 5 MG/5ML IJ SOLN
INTRAMUSCULAR | Status: DC | PRN
  Administered 2014-10-01 (×2): 0.5 mg via INTRAVENOUS

## 2014-10-01 MED ORDER — ROCURONIUM BROMIDE 100 MG/10ML IV SOLN
INTRAVENOUS | Status: AC
  Filled 2014-10-01: qty 1

## 2014-10-01 MED ORDER — HYDROCODONE-ACETAMINOPHEN 5-325 MG PO TABS: 1.0000 | ORAL_TABLET | Freq: Four times a day (QID) | ORAL | Status: DC | PRN

## 2014-10-01 MED ORDER — HYDROMORPHONE HCL 2 MG/ML IJ SOLN
INTRAMUSCULAR | Status: AC
  Filled 2014-10-01: qty 1

## 2014-10-01 MED ORDER — SODIUM CHLORIDE 0.9 % IJ SOLN
INTRAMUSCULAR | Status: DC | PRN
  Administered 2014-10-01: 20 mL

## 2014-10-01 SURGICAL SUPPLY — 51 items
CHLORAPREP W/TINT 26ML (MISCELLANEOUS) ×3 IMPLANT
CLIP LIGATING HEM O LOK PURPLE (MISCELLANEOUS) ×2 IMPLANT
CLIP LIGATING HEMO LOK XL GOLD (MISCELLANEOUS) ×3 IMPLANT
CLIP LIGATING HEMO O LOK GREEN (MISCELLANEOUS) IMPLANT
CORDS BIPOLAR (ELECTRODE) ×3 IMPLANT
COVER SURGICAL LIGHT HANDLE (MISCELLANEOUS) ×3 IMPLANT
COVER TIP SHEARS 8 DVNC (MISCELLANEOUS) ×1 IMPLANT
COVER TIP SHEARS 8MM DA VINCI (MISCELLANEOUS) ×2
CUTTER ECHEON FLEX ENDO 45 340 (ENDOMECHANICALS) ×6 IMPLANT
DECANTER SPIKE VIAL GLASS SM (MISCELLANEOUS) ×3 IMPLANT
DRAIN CHANNEL 15F RND FF 3/16 (WOUND CARE) ×3 IMPLANT
DRAPE INCISE IOBAN 66X45 STRL (DRAPES) ×3 IMPLANT
DRAPE LAPAROSCOPIC ABDOMINAL (DRAPES) ×3 IMPLANT
DRAPE SHEET LG 3/4 BI-LAMINATE (DRAPES) ×6 IMPLANT
DRAPE TABLE BACK 44X90 PK DISP (DRAPES) ×3 IMPLANT
DRAPE WARM FLUID 44X44 (DRAPE) ×3 IMPLANT
ELECT REM PT RETURN 9FT ADLT (ELECTROSURGICAL) ×6
ELECTRODE REM PT RTRN 9FT ADLT (ELECTROSURGICAL) ×2 IMPLANT
EVACUATOR SILICONE 100CC (DRAIN) ×3 IMPLANT
GLOVE BIOGEL M STRL SZ7.5 (GLOVE) ×6 IMPLANT
GOWN STRL REUS W/TWL LRG LVL3 (GOWN DISPOSABLE) ×6 IMPLANT
KIT ACCESSORY DA VINCI DISP (KITS) ×2
KIT ACCESSORY DVNC DISP (KITS) ×1 IMPLANT
KIT BASIN OR (CUSTOM PROCEDURE TRAY) ×3 IMPLANT
LIQUID BAND (GAUZE/BANDAGES/DRESSINGS) ×4 IMPLANT
LOOP VESSEL MAXI BLUE (MISCELLANEOUS) ×3 IMPLANT
MARKER SKIN DUAL TIP RULER LAB (MISCELLANEOUS) ×2 IMPLANT
NDL INSUFFLATION 14GA 120MM (NEEDLE) ×1 IMPLANT
NEEDLE INSUFFLATION 14GA 120MM (NEEDLE) ×3 IMPLANT
PENCIL BUTTON HOLSTER BLD 10FT (ELECTRODE) ×3 IMPLANT
POSITIONER SURGICAL ARM (MISCELLANEOUS) ×6 IMPLANT
POUCH ENDO CATCH II 15MM (MISCELLANEOUS) ×3 IMPLANT
RELOAD WH ECHELON 45 (STAPLE) ×10 IMPLANT
SET TUBE IRRIG SUCTION NO TIP (IRRIGATION / IRRIGATOR) ×3 IMPLANT
SOLUTION ANTI FOG 6CC (MISCELLANEOUS) ×3 IMPLANT
SOLUTION ELECTROLUBE (MISCELLANEOUS) ×3 IMPLANT
SPONGE LAP 18X18 X RAY DECT (DISPOSABLE) ×3 IMPLANT
SPONGE LAP 4X18 X RAY DECT (DISPOSABLE) ×3 IMPLANT
SUT ETHILON 3 0 PS 1 (SUTURE) ×3 IMPLANT
SUT MNCRL AB 4-0 PS2 18 (SUTURE) ×6 IMPLANT
SUT PDS AB 1 CT1 27 (SUTURE) ×12 IMPLANT
SUT VICRYL 0 UR6 27IN ABS (SUTURE) ×6 IMPLANT
SYR BULB IRRIGATION 50ML (SYRINGE) IMPLANT
TAPE CLOTH 4X10 WHT NS (GAUZE/BANDAGES/DRESSINGS) ×3 IMPLANT
TOWEL OR NON WOVEN STRL DISP B (DISPOSABLE) ×6 IMPLANT
TRAY FOLEY W/METER SILVER 14FR (SET/KITS/TRAYS/PACK) ×3 IMPLANT
TRAY LAPAROSCOPIC (CUSTOM PROCEDURE TRAY) ×3 IMPLANT
TROCAR 12M 150ML BLUNT (TROCAR) ×3 IMPLANT
TROCAR XCEL 12X100 BLDLESS (ENDOMECHANICALS) ×3 IMPLANT
TUBING INSUFFLATION 10FT LAP (TUBING) ×3 IMPLANT
WATER STERILE IRR 1500ML POUR (IV SOLUTION) ×6 IMPLANT

## 2014-10-01 NOTE — Progress Notes (Signed)
UROLOGY PROGRESS NOTES  Assessment/Plan: [name] is a 81 y.o. female POD 0 status post a robot-assisted laparoscopic RIGHT radical nephrectomy with limited pelvic lymph node dissection. Currently stable.  -Continue current pain regimen -Clear liquid diet -MIVF -Foley to drainage, plan on removal and TOV in the morning -Monitor Is and Os -Plan for regular diet in the morning  Subjective: Recovering on the floor. Pain well controlled (3/10) on PO medication. Denies N/V. Foley draining clear, yellow urine without clot.  Post-op Hb  Lab Results  Component Value Date   HGB 12.7 10/01/2014   Objective:  Vital signs in last 24 hours: Filed Vitals:   10/01/14 1630  BP: 129/57  Pulse: 81  Temp: 97.7 F (36.5 C)  Resp: 16     Intake/Output last 24 hours:   Total I/O In: 2801 [I.V.:2801] Out: 89 [Urine:540; Blood:50]   Physical Exam: General: No acute distress Pulmonary: Normal work of breathing Cardiovascular: Pulse regular rate and rhythm Abdomen: Soft, nontender, nondistended, incisions c/d/i Foley: secure, draining clear urine without clot Extremities: Warm and well perfused Neuro: Intact, no obvious deficits  Data Review: WBC      Lab Results  Component Value Date   WBC 3.8* 09/28/2014   HGB 12.7 10/01/2014   HCT 39.4 10/01/2014   PLT 264 09/28/2014     Lab Results  Component Value Date   NA 137 09/28/2014   K 4.8 09/28/2014   CL 99* 09/28/2014   CO2 29 09/28/2014   BUN 15 09/28/2014   CREATININE 0.73 09/28/2014   CALCIUM 9.7 09/28/2014

## 2014-10-01 NOTE — Anesthesia Procedure Notes (Signed)
Procedure Name: Intubation Date/Time: 10/01/2014 12:52 PM Performed by: Lissa Morales Pre-anesthesia Checklist: Patient identified, Emergency Drugs available, Suction available, Patient being monitored and Timeout performed Patient Re-evaluated:Patient Re-evaluated prior to inductionOxygen Delivery Method: Circle system utilized Preoxygenation: Pre-oxygenation with 100% oxygen Intubation Type: IV induction Ventilation: Mask ventilation without difficulty Grade View: Grade I Tube type: Oral Tube size: 7.0 mm Number of attempts: 1 Airway Equipment and Method: Stylet Placement Confirmation: ETT inserted through vocal cords under direct vision,  breath sounds checked- equal and bilateral and positive ETCO2 Secured at: 21 cm Tube secured with: Tape Dental Injury: Teeth and Oropharynx as per pre-operative assessment

## 2014-10-01 NOTE — Discharge Summary (Signed)
Urology Discharge Summary  Admit date: 10/01/2014  Discharge date and time: 10/03/2014  Discharge to:  Home  Discharge Service: [facility]  Discharge Attending Physician: Alexis Frock, MD  Discharge  Diagnoses: RIGHT renal mass  Secondary Diagnosis: Active Problems:   * No active hospital problems. *   OR Procedures: Procedure(s): ROBOTIC ASSISTED LAPAROSCOPIC NEPHRECTOMY 10/01/2014   Ancillary Procedures: none  Discharge Day Services: The patient was seen and examined by the Urology team both in the morning and immediately prior to discharge.  Vital signs and laboratory values were stable and within normal limits.  The physical exam was benign and unchanged and all surgical wounds were examined.  Discharge instructions were explained and all questions answered.  Subjective  NAEON. Voiding volitionally without difficulty. Pain well controlled. Ambulating. Tolerating regular diet.  Objective Patient Vitals for the past 8 hrs:  BP Temp Temp src Pulse Resp SpO2  10/01/14 0955 (!) 147/82 mmHg 97.8 F (36.6 C) Oral 92 16 98 %      Discharge Exam: General appearance: alert, cooperative and appears stated age Head: Normocephalic, without obvious abnormality, atraumatic Nose: Nares normal. Septum midline. Mucosa normal. No drainage or sinus tenderness. Throat: lips, mucosa, and tongue normal; teeth and gums normal Resp: non-labored on room air Cardio: Nl rate GI: soft, non-tender; bowel sounds normal; no masses, no organomegaly Female genitalia: normal, foley c/d/i with yellow urine in foley bag, removed the morning of POD 1 Extremities: extremities normal, atraumatic, no cyanosis or edema Pulses: 2+ and symmetric Skin: Skin color, texture, turgor normal. No rashes or lesions Lymph nodes: Cervical, supraclavicular, and axillary nodes normal. Neurologic: Grossly normal Incision/Wound: recent port sites / extraction sites c/d/i.    Hospital Course:  The  patient underwent RIGHT robot-assisted laparoscopic radical nephrectomy on 10/01/2014.  The patient tolerated the procedure well, was extubated in the OR, and afterwards was taken to the PACU for routine post-surgical care. When stable the patient was transferred to the floor.   The patient did well postoperatively.  The patient's diet was slowly advanced and at the time of discharge was tolerating a regular diet.  The patient was discharged home on POD 2, at which point was tolerating a regular solid diet, have adequate pain control with P.O. pain medication, and could ambulate without difficulty. Foley catheter was removed POD1. She had some voiding issues for the first 6 hours after removal but was subsequently able to void without difficulty. Her pain was well controlled on acetaminophen throughout hospitalization.  The patient will follow up with [place] for post op check as scheduled below.   Condition at Discharge: Improved Discharge Medications:  No current facility-administered medications on file prior to encounter.   Current Outpatient Prescriptions on File Prior to Encounter  Medication Sig Dispense Refill  . cholecalciferol (VITAMIN D) 1000 UNITS tablet Take 1,000 Units by mouth every morning.     . fluticasone (FLONASE) 50 MCG/ACT nasal spray Place 2 sprays into the nose daily as needed for allergies or rhinitis.     Marland Kitchen LORazepam (ATIVAN) 0.5 MG tablet Take 0.5 mg by mouth at bedtime.     . Multiple Vitamin (MULTIVITAMIN) tablet Take 1 tablet by mouth every morning.     . naphazoline-glycerin (CLEAR EYES) 0.012-0.2 % SOLN Place 1-2 drops into both eyes every 4 (four) hours as needed for irritation.    . naproxen sodium (ANAPROX) 220 MG tablet Take 220 mg by mouth 2 (two) times daily as needed (Pain).    Marland Kitchen  olmesartan (BENICAR) 5 MG tablet Take 5 mg by mouth every morning.        Pending Test Results: Surgical pathology  Discharge Instructions:  1- Drain Sites - You may have some mild  persistent drainage from old drain site for several days, this is normal. This can be covered with cotton gauze for convenience.  2 - Stiches - Your stitches are all dissolvable. You may notice a "loose thread" at your incisions, these are normal and require no intervention. You may cut them flush to the skin with fingernail clippers if needed for comfort.  3 - Diet - No restrictions  4 - Activity - No heavy lifting / straining (any activities that require valsalva or "bearing down") x 4 weeks. Otherwise, no restrictions.  5 - Bathing - You may shower immediately. Do not take a bath or get into swimming pool where incision sites are submersed in water x 4 weeks.   6 - When to Call the Doctor - Call MD for any fever >102, any acute wound problems, or any severe nausea / vomiting. You can call the [facility] [phone]) 24 hours a day 365 days a year. It will roll-over to the answering service and on-call physician after hours.  Follow-up:  Follow-up Information    Follow up with Alexis Frock, MD On 10/18/2014.   Specialty:  Urology   Why:  10:45 am   Contact information:   [place] [id number] [phone]

## 2014-10-01 NOTE — Brief Op Note (Signed)
10/01/2014  3:28 PM  PATIENT:  [name]  81 y.o. female  PRE-OPERATIVE DIAGNOSIS:  RIGHT RENAL MASS  POST-OPERATIVE DIAGNOSIS:  RIGHT RENAL MASS  PROCEDURE:  Procedure(s): ROBOTIC ASSISTED LAPAROSCOPIC NEPHRECTOMY LYSIS OF ADHESIONS (Right)  SURGEON:  Surgeon(s) and Role:    * Alexis Frock, MD - Primary  PHYSICIAN ASSISTANT:   ASSISTANTS: 1 - Clemetine Marker, PA; 2 - Verdis Frederickson MD   ANESTHESIA:   local and general  EBL:  Total I/O In: 2001 [I.V.:2001] Out: 560 [Urine:510; Blood:50]  BLOOD ADMINISTERED:none  DRAINS: foley to straight drain   LOCAL MEDICATIONS USED:  MARCAINE     SPECIMEN:  Source of Specimen:  right radical nephrectomy  DISPOSITION OF SPECIMEN:  PATHOLOGY  COUNTS:  YES  TOURNIQUET:  * No tourniquets in log *  DICTATION: .Other Dictation: Dictation Number [id number]  PLAN OF CARE: Admit to inpatient   PATIENT DISPOSITION:  PACU - hemodynamically stable.   Delay start of Pharmacological VTE agent (>24hrs) due to surgical blood loss or risk of bleeding: yes

## 2014-10-01 NOTE — Transfer of Care (Signed)
Immediate Anesthesia Transfer of Care Note  Patient: [name]  Procedure(s) Performed: Procedure(s): ROBOTIC ASSISTED LAPAROSCOPIC NEPHRECTOMY LYSIS OF ADHESIONS (Right)  Patient Location: PACU  Anesthesia Type:General  Level of Consciousness: awake, alert , oriented and patient cooperative  Airway & Oxygen Therapy: Patient Spontanous Breathing and Patient connected to face mask oxygen  Post-op Assessment: Report given to RN and Post -op Vital signs reviewed and stable  Post vital signs: Reviewed and stable  Last Vitals:  Filed Vitals:   10/01/14 1542  BP:   Pulse: 94  Temp:   Resp: 17    Complications: No apparent anesthesia complications

## 2014-10-01 NOTE — H&P (Signed)
[name] is an 81 y.o. female.    Chief Complaint: Pre-op Right Radial Nephrectomy  HPI:    1 - Right Renal Mass - 3.5cm 100% endophytic solid enhancing right renal mass incidental by CT 07/2014 on eval vague abdominal pain and some functional GI complaints. No contralateral lesions. Mass does abut collecting system with some lower pole intrarenal hydro. Favor renal cell carcinoma by morphology but transitional cell lesion also in differential. NO gross hematuria. CXR normal. Diagnostic ureteroscopy confirms coritcal lesion. Appear 1 artery,  1 tortuous vein renovascular anatomy.  PMH sig for appy, lap chole, HTN. 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 Her PCP is Haley Small MD.  Today " [name] " is seen to proceed with right radical nephrecotmy. No interval fevers. Cr <1.   Past Medical History  Diagnosis Date  . Hypertension   . Pulmonary nodules   . BOOP (bronchiolitis obliterans with organizing pneumonia)   . Claustrophobia   . Arthritis   . Dysrhythmia   . Bronchitis     hx of   . Chronic kidney disease     Past Surgical History  Procedure Laterality Date  . Tonsillectomy    . Appendectomy    . Cholecystectomy    . Hip surgery      partial replacement of left hip   . Meniscus tear surgery in right knee     . Cataract surgery       bilateral   . Cystoscopy with retrograde pyelogram, ureteroscopy and stent placement Right 08/20/2014    Procedure: CYSTOSCOPY WITH RIGHT RETROGRADE PYELOGRAM, RIGHT DIAGNOSTIC URETEROSCOPY, RIGHT URETERAL STENT PLACEMENT;  Surgeon: Haley Frock, MD;  Location: [facility];  Service: Urology;  Laterality: Right;    Family History  Problem Relation Age of Onset  . Hypertension Mother   . Cancer Mother   . Heart failure Father   . Prostate cancer Brother    Social History:  reports that she has never smoked. She has never used smokeless tobacco. She reports that she drinks alcohol. She reports  that she does not use illicit drugs.  Allergies:  Allergies  Allergen Reactions  . Tramadol Hcl Nausea And Vomiting  . Codeine     Hallucinations     No prescriptions prior to admission    No results found for this or any previous visit (from the past 48 hour(s)). No results found.  Review of Systems  Constitutional: Negative.   HENT: Negative.   Eyes: Negative.   Respiratory: Negative.   Cardiovascular: Negative.   Gastrointestinal: Negative.   Genitourinary: Negative.   Musculoskeletal: Negative.   Skin: Negative.   Neurological: Negative.   Endo/Heme/Allergies: Negative.   Psychiatric/Behavioral: Negative.     There were no vitals taken for this visit. Physical Exam  Constitutional: She appears well-developed.  HENT:  Head: Normocephalic.  Eyes: Pupils are equal, round, and reactive to light.  Neck: Normal range of motion.  Cardiovascular: Normal rate.   Respiratory: Effort normal.  GI: Soft.  Genitourinary:  No CVAT  Musculoskeletal: Normal range of motion.  Neurological: She is alert.  Skin: Skin is warm.  Psychiatric: She has a normal mood and affect. Her behavior is normal. Judgment and thought content normal.     Assessment/Plan  1 - Right Renal Mass - likely RCC, not amenable to nephron-sparing. Proceed as planned with surgery 10/01/14.  We rediscussed the role of radical nephrectomy with the overall goal of complete surgical  excision (negative margins) and better staging / diagnosis. We specifically rediscussed that with removal of the kidney there would be an overall renal function decline with attendant risks of renal failure and need for dialysis in some cases, and need for kidney-friendly lifestyle post-op with excellent blood pressure and glycemic control. We then rediscussed surgical approaches including robotic and open techniques with robotic associated with a shorter convalescence. I showed the patient on their abdomen the approximately 4-6  incision (trocar) sites as well as presumed extraction sites with robotic approach as well as possible open incision sites. We specifically readdressed that there may be need to alter operative plans according to intraopertive findings including conversion to open procedure. We rediscussed specific peri-operative risks including bleeding, infection, deep vein thrombosis, pulmonary embolism, compartment syndrome, nuropathy / neuropraxia, heart attack, stroke, death, as well as long-term risks such as non-cure / need for additional therapy and need for imaging and lab based post-op surveillance protocols. We rediscussed typical hospital course of approximately 2 day hospitalization, need for peri-operative drains / catheters, and typical post-hospital course with return to most non-strenuous activities by 2 weeks and ability to return to most jobs and more strenuous activity such as exercise by 6 weeks.    After this lengthy and detail discussion, including answering all of the patient's questions to their satisfaction, they have chosen to proceed today as planned.  [name] 10/01/2014, 5:58 AM

## 2014-10-01 NOTE — Anesthesia Postprocedure Evaluation (Signed)
  Anesthesia Post-op Note  Patient: [name]  Procedure(s) Performed: Procedure(s) (LRB): ROBOTIC ASSISTED LAPAROSCOPIC NEPHRECTOMY LYSIS OF ADHESIONS (Right)  Patient Location: PACU  Anesthesia Type: General  Level of Consciousness: awake and alert   Airway and Oxygen Therapy: Patient Spontanous Breathing  Post-op Pain: mild  Post-op Assessment: Post-op Vital signs reviewed, Patient's Cardiovascular Status Stable, Respiratory Function Stable, Patent Airway and No signs of Nausea or vomiting  Last Vitals:  Filed Vitals:   10/01/14 0955  BP: 147/82  Pulse: 92  Temp: 36.6 C  Resp: 16    Post-op Vital Signs: stable   Complications: No apparent anesthesia complications

## 2014-10-02 LAB — BASIC METABOLIC PANEL
Anion gap: 8 (ref 5–15)
BUN: 19 mg/dL (ref 6–20)
CHLORIDE: 98 mmol/L — AB (ref 101–111)
CO2: 27 mmol/L (ref 22–32)
CREATININE: 1.17 mg/dL — AB (ref 0.44–1.00)
Calcium: 8.5 mg/dL — ABNORMAL LOW (ref 8.9–10.3)
GFR calc non Af Amer: 42 mL/min — ABNORMAL LOW (ref >60)
GFR, EST AFRICAN AMERICAN: 49 mL/min — AB (ref >60)
Glucose, Bld: 163 mg/dL — ABNORMAL HIGH (ref 65–99)
Potassium: 4.3 mmol/L (ref 3.5–5.1)
Sodium: 133 mmol/L — ABNORMAL LOW (ref 135–145)

## 2014-10-02 LAB — HEMOGLOBIN AND HEMATOCRIT, BLOOD
HEMATOCRIT: 34.1 % — AB (ref 36.0–46.0)
Hemoglobin: 11.2 g/dL — ABNORMAL LOW (ref 12.0–15.0)

## 2014-10-02 MED ORDER — ACETAMINOPHEN 500 MG PO TABS
1000.0000 mg | ORAL_TABLET | Freq: Four times a day (QID) | ORAL | Status: AC
  Administered 2014-10-02 – 2014-10-03 (×4): 1000 mg via ORAL
  Filled 2014-10-02 (×4): qty 2

## 2014-10-02 MED ORDER — DEXTROSE-NACL 5-0.45 % IV SOLN
INTRAVENOUS | Status: DC
  Administered 2014-10-02 (×3): via INTRAVENOUS

## 2014-10-02 MED ORDER — PROMETHAZINE HCL 25 MG/ML IJ SOLN
6.2500 mg | Freq: Once | INTRAMUSCULAR | Status: AC
  Administered 2014-10-02: 6.25 mg via INTRAVENOUS
  Filled 2014-10-02: qty 1

## 2014-10-02 MED ORDER — HYDROCODONE-ACETAMINOPHEN 5-325 MG PO TABS: 1.0000 | ORAL_TABLET | ORAL | Status: DC | PRN

## 2014-10-02 MED ORDER — HYDROCODONE-ACETAMINOPHEN 5-325 MG PO TABS
1.0000 | ORAL_TABLET | ORAL | Status: DC | PRN
  Filled 2014-10-02: qty 1

## 2014-10-02 MED ORDER — ACETAMINOPHEN 10 MG/ML IV SOLN
1000.0000 mg | Freq: Once | INTRAVENOUS | Status: AC
  Administered 2014-10-02: 1000 mg via INTRAVENOUS
  Filled 2014-10-02: qty 100

## 2014-10-02 NOTE — Progress Notes (Signed)
UROLOGY PROGRESS NOTES  Assessment/Plan: [name] is a 81 y.o. female POD 1 status post a robot-assisted laparoscopic RIGHT radical nephrectomy with limited pelvic lymph node dissection. Currently stable. Some nausea over night but this is typical for her following anesthesia. Will continue to monitor. Expected spike in creatinine given nephron loss.  -Advance to full liquid diet -saline lock IV -d/c foley, follow-up TOV -Encourage OOB/AMB -Will recheck labs again tomorrow morning and plan for tentative discharge tomorrow if continues to progress well  Subjective: NAEON. Some nausea and small volume emesis overnight (30cc). Pain very well controlled on tylenol. Ambulating minimally. No BM/flatus.  Objective: BP 138/59 mmHg  Pulse 79  Temp(Src) 97.8 F (36.6 C) (Oral)  Resp 18  Ht 5' 3.5" (1.613 m)  Wt 51.71 kg (114 lb)  BMI 19.87 kg/m2  SpO2 99%   Intake/Output last 24 hours: I/O last 3 completed shifts: In: 3260.2 [P.O.:120; I.V.:3140.2] Out: 45 [Urine:990; Emesis/NG output:30; Blood:50]    Physical Exam: General: No acute distress Pulmonary: Normal work of breathing Cardiovascular: Pulse regular rate and rhythm Abdomen: Soft, nontender, nondistended, incisions c/d/i GU: foley draining clear,yellow urine Extremities: Warm and well perfused Neuro: Intact, no obvious deficits  Data Review: WBC      Lab Results  Component Value Date   WBC 3.8* 09/28/2014   HGB 11.2* 10/02/2014   HCT 34.1* 10/02/2014   PLT 264 09/28/2014     Lab Results  Component Value Date   NA 133* 10/02/2014   K 4.3 10/02/2014   CL 98* 10/02/2014   CO2 27 10/02/2014   BUN 19 10/02/2014   CREATININE 1.17* 10/02/2014   CALCIUM 8.5* 10/02/2014   Patient was seen, examined,treatment plan was discussed with the resident.  I have directly reviewed the clinical findings, lab, imaging studies and management of this patient in detail. I have made the necessary changes and/or  additions to the above noted documentation, and agree with the documentation, as recorded by the resident.

## 2014-10-02 NOTE — Op Note (Signed)
[name], [name]          ACCOUNT NO.:  [id number]  MEDICAL RECORD NO.:  [id number]  LOCATION:  1157                         FACILITY:  [name]  PHYSICIAN:  Haley Frock, MD     DATE OF BIRTH:  1930/04/16  DATE OF PROCEDURE: 10/01/2014                              OPERATIVE REPORT  DIAGNOSIS:  Endophytic right renal mass.  PROCEDURES: 1. Robotic-assisted laparoscopic right radical nephrectomy. 2. Laparoscopic extensive adhesiolysis.  ESTIMATED BLOOD LOSS:  Less than 50 mL.  COMPLICATIONS:  None.  SPECIMEN:  Right radical nephrectomy.  ASSISTANTS: 1. Haley Marker, PA. 2. Haley Age, MD  DRAINS:  Foley catheter to straight drain.  FINDINGS: 1. Extensive adhesions in the area of the cecum and gallbladder fossa,     this necessitated approximately 30 minutes of dedicated     adhesiolysis. 2. Very large liver, inferior liver edge more inferior than lower edge     of kidney. 3. Single-artery, single-vein right renovascular anatomy.  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  She underwent diagnostic ureteroscopy recently to confirm, this was not a urothelial lesion and it was confirmed not to be. Options were discussed for management including surveillance protocols versus ablative therapies versus surgical extirpation given the completely endophytic nature of the lesion and it was felt that surgery would require radical nephrectomy and there was no role for nephron sparing whatsoever.  After consideration of these options, the patient wished to proceed with right radical nephrectomy with minimally invasive assistance.  Informed consent was obtained and placed in the medical record.  PROCEDURE IN DETAIL:  The patient being [name], was verified.  Procedure being right robotic radical nephrectomy was confirmed.  Procedure was carried out.   Time-out was performed. Intravenous antibiotics were administered.  General endotracheal anesthesia was induced.  The patient was placed into a right side up, full flank position employing 15 degrees of table flexion, superior arm elevator, axillary roll, sequential compression devices, and beanbag. Her bottom leg was bent and top leg straight.  She was further fashioned on the operative table using 3-inch tape over foam padding across her supra-xiphoid chest and pelvis.  Sterile field was created by prepping and draping the patient's right flank using chlorhexidine gluconate.  Next, a high-flow, low-pressure pneumoperitoneum was obtained using Veress technique in the right lower quadrant having passed the aspiration and drop test.  Next, an 8-mm robotic camera port was placed and position approximately 3 fingerbreadths superolateral to the umbilicus.  Laparoscopic examination of the peritoneal cavity revealed a very large liver at the inferior edge extending into the true pelvis, there were also multiple significant adhesions noted especially in the area of the cecum and the area of the inferior and lateral liver edges, this was considered with known prior right appendectomy and cholecystectomy.  Additional ports were then carefully placed as follows:  The right inferior paramedian robotic port approximately 4 fingerbreadths superior to the pubic ramus.  The right far lateral robotic port approximately 3 fingerbreadths superomedial to the anterior superior iliac spine.  A 5-mm liver retraction port in the subxiphoid location and an 8-mm robotic port in the  subcostal location and a 12-mm AirSeal assistant port in the midline medially below the umbilicus. Robot was docked and passed through the electronic checks.  Initial attention was directed to the adhesiolysis.  Very careful adhesiolysis was performed taking down mostly thin adhesions between the lateral edge of the cecum and ascending  colon and the anterolateral abdominal wall. This allowing the ascending colon to carefully rotate medially and allow exposure of the retroperitoneum.  Again, additional adhesiolysis was carefully performed to multiple thin adhesions between the ascending and transverse colon and the inferior liver edge and the lateral liver edge again allowing the ascending and transverse colons to rotate way from the inferior liver.  Next, a self-locking grasper was very carefully navigated inferior to the liver displacing the liver on superior traction by locking into the posterior abdominal musculature, this allowed much better exposure of the area of the kidney.  The duodenum was encountered and carefully kocherized medially.  Lower pole of the kidney was identified, placed on gentle lateral traction.  Dissection proceeded medial to this.  The ureter was encountered, doubley clipped and ligated, and also placed on gentle lateral traction.  There were numerous gonadal and pelvic vessels near the ureter, which were controlled using combination of bipolar energy and doubly clipping of the gonadal.  Dissection was proceeded in this triangle superiorly towards the area of the renal hilum.  The renal hilum consisted of single artery, single vein renovascular anatomy.  There were some significant lumbar vessels posteriorly.  A single staple load was used to control the posterior lumbar vessels, which were then allowed better circumferential access to the right renal artery, which was circumferentially mobilized and controlled using extra large Hem-O-Lok clip proximally followed by vascular load stapler distally.  The renal vein then appeared completely flat suggesting excellent sensation of arterial inflow and it was controlled using a single load of vascular stapler.  Additional medial attachments were taken down superiorly and then finally, superior attachments and lateral attachments were completely freed  up the right nephrectomy specimen, which was placed in an extra large EndoCatch bag for later retrieval.  The nephrectomy bed was visualized as was the hilum, this was found to be completely hemostatic.  The liver was carefully inspected and found to be uninjured as was the area of adhesiolysis in ascending and transverse colon. Robot was then undocked.  Specimen was retrieved by extending the previous assistant port site superiorly for total distance of approximately 4 cm removing the right kidney and setting aside for permanent pathology.  Given the patient's very thin body habitus, all of the 8-mm port sites were closed at the level of fascia using Vicryl and UR needle and the extraction site was closed at the level of fascia using figure-of-eight PDS x4 followed by reapproximation of Scarpa's with running Vicryl.  All incision sites were infiltrated with dilute lyophilized Marcaine and closed at the level of the skin using subcuticular Monocryl followed by Dermabond.  Procedure was then terminated.  The patient tolerated the procedure well.  There were no immediate periprocedural complications.  The patient was taken to the postanesthesia care unit in stable condition.          ______________________________ Haley Frock, MD    TM/MEDQ  D:  10/01/2014  T:  10/02/2014  Job:  [id number]

## 2014-10-02 NOTE — Progress Notes (Signed)
MD notified about patients unrelieved nausea. New orders for phenergan. Bladder scan revealed 200cc urine in bladder. Orders to I&O cath if >500. Will continue to monitor.  Barbee Shropshire. Brigitte Pulse, RN

## 2014-10-03 LAB — BASIC METABOLIC PANEL
Anion gap: 3 — ABNORMAL LOW (ref 5–15)
BUN: 13 mg/dL (ref 6–20)
CO2: 29 mmol/L (ref 22–32)
Calcium: 8.4 mg/dL — ABNORMAL LOW (ref 8.9–10.3)
Chloride: 103 mmol/L (ref 101–111)
Creatinine, Ser: 1.24 mg/dL — ABNORMAL HIGH (ref 0.44–1.00)
GFR calc non Af Amer: 39 mL/min — ABNORMAL LOW (ref >60)
GFR, EST AFRICAN AMERICAN: 46 mL/min — AB (ref >60)
Glucose, Bld: 93 mg/dL (ref 65–99)
POTASSIUM: 3.9 mmol/L (ref 3.5–5.1)
SODIUM: 135 mmol/L (ref 135–145)

## 2014-10-03 LAB — CBC
HEMATOCRIT: 34.7 % — AB (ref 36.0–46.0)
Hemoglobin: 11.4 g/dL — ABNORMAL LOW (ref 12.0–15.0)
MCH: 31.7 pg (ref 26.0–34.0)
MCHC: 32.9 g/dL (ref 30.0–36.0)
MCV: 96.4 fL (ref 78.0–100.0)
PLATELETS: 217 10*3/uL (ref 150–400)
RBC: 3.6 MIL/uL — AB (ref 3.87–5.11)
RDW: 12.8 % (ref 11.5–15.5)
WBC: 8.5 10*3/uL (ref 4.0–10.5)

## 2014-10-03 MED ORDER — SENNA 8.6 MG PO TABS
2.0000 | ORAL_TABLET | Freq: Every day | ORAL | Status: DC
  Administered 2014-10-03: 17.2 mg via ORAL
  Filled 2014-10-03: qty 2

## 2014-10-03 MED ORDER — HYDROCODONE-ACETAMINOPHEN 5-325 MG PO TABS: 1.0000 | ORAL_TABLET | ORAL | Status: DC | PRN

## 2014-10-03 MED ORDER — SENNA 8.6 MG PO TABS: 2.0000 | ORAL_TABLET | Freq: Every day | ORAL | Status: DC

## 2014-10-03 MED ORDER — BISACODYL 10 MG RE SUPP
10.0000 mg | Freq: Once | RECTAL | Status: AC
  Administered 2014-10-03: 10 mg via RECTAL
  Filled 2014-10-03: qty 1

## 2014-10-03 MED ORDER — DOCUSATE SODIUM 100 MG PO CAPS: 100.0000 mg | ORAL_CAPSULE | Freq: Two times a day (BID) | ORAL | Status: DC

## 2014-10-03 NOTE — Progress Notes (Signed)
UROLOGY PROGRESS NOTES  Assessment/Plan: [name] is a 81 y.o. female POD 2 status post a robot-assisted laparoscopic RIGHT radical nephrectomy with limited pelvic lymph node dissection. Currently stable. Constipated, Pain well controlled. Voiding. Continued expected spike in creatinine given nephron loss. Feels much improved this morning.  -Advance to regular diet -saline lock IV -dulcolax suppository and enhanced bowel regimen -Encourage ambulation -Tentatively plan for discharge this PM is doing well  Subjective: [name]. Difficult day yesterday generally felt "not herself" and has some minor issues with urinary retention which have resolved and she is now voiding volitonally without difficulty. Abdominal exam slightly more distended and not passing BM or flatus. Pain controlled on minimal PO medication.  Objective: BP 157/86 mmHg  Pulse 68  Temp(Src) 98.1 F (36.7 C) (Oral)  Resp 18  Ht 5' 3.5" (1.613 m)  Wt 51.71 kg (114 lb)  BMI 19.87 kg/m2  SpO2 98%   Intake/Output last 24 hours: I/O last 3 completed shifts: In: 1332.1 [P.O.:180; I.V.:1152.1] Out: 2006 [Urine:1975; Emesis/NG output:31]    Physical Exam: General: No acute distress Pulmonary: Normal work of breathing Cardiovascular: Pulse regular rate and rhythm Abdomen: Soft, nontender, moderately distended, incisions c/d/i GU: foley draining clear,yellow urine Extremities: Warm and well perfused Neuro: Intact, no obvious deficits  Data Review: WBC      Lab Results  Component Value Date   WBC 8.5 10/03/2014   HGB 11.4* 10/03/2014   HCT 34.7* 10/03/2014   PLT 217 10/03/2014     Lab Results  Component Value Date   NA 135 10/03/2014   K 3.9 10/03/2014   CL 103 10/03/2014   CO2 29 10/03/2014   BUN 13 10/03/2014   CREATININE 1.24* 10/03/2014   CALCIUM 8.4* 10/03/2014

## 2014-10-03 NOTE — Discharge Instructions (Signed)
1- Drain Sites - You may have some mild persistent drainage from old drain site for several days, this is normal. This can be covered with cotton gauze for convenience.  2 - Stiches - Your stitches are all dissolvable. You may notice a "loose thread" at your incisions, these are normal and require no intervention. You may cut them flush to the skin with fingernail clippers if needed for comfort.  3 - Diet - No restrictions  4 - Activity - No heavy lifting anything over 10 pounds/ straining (any activities that require valsalva or "bearing down") x 6 weeks. Otherwise, no restrictions.  5 - Bathing - You may shower immediately. Do not take a bath or get into swimming pool where incision sites are submersed in water x 4 weeks.   6 - When to Call the Doctor - Call MD for any fever >102, any acute wound problems, or any severe nausea / vomiting. You can call the [facility] [phone]) 24 hours a day 365 days a year. It will roll-over to the answering service and on-call physician after hours.  7. You may resume aspirin, advil, aleve, vitamins, and supplements 7 days after surgery.

## 2014-10-04 ENCOUNTER — Encounter (HOSPITAL_COMMUNITY): Payer: Self-pay | Admitting: Urology

## 2014-10-05 ENCOUNTER — Encounter (HOSPITAL_COMMUNITY): Payer: Self-pay | Admitting: Urology

## 2014-10-26 ENCOUNTER — Telehealth: Payer: Self-pay | Admitting: Critical Care Medicine

## 2014-10-26 NOTE — Telephone Encounter (Signed)
Called and spoke to pt. Pt requesting an appt with PW due to a recent cough from an ET tube placement for her kidney surgery. Offered pt an appt with PW today (8.2.16) in [place] but pt declined. Appt made with PW on 9.14.16 in [place]. Pt stated she will call back to see if there are any cancellations to be worked in sooner. Pt denies SOB, CP/tightness, f/c/s. Will sign off.

## 2014-11-24 ENCOUNTER — Ambulatory Visit (INDEPENDENT_AMBULATORY_CARE_PROVIDER_SITE_OTHER): Payer: Medicare Other | Admitting: Adult Health

## 2014-11-24 ENCOUNTER — Ambulatory Visit (INDEPENDENT_AMBULATORY_CARE_PROVIDER_SITE_OTHER)
Admission: RE | Admit: 2014-11-24 | Discharge: 2014-11-24 | Disposition: A | Discharge status: Home / Self Care | Payer: Medicare Other | Source: Ambulatory Visit | Attending: Adult Health | Admitting: Adult Health

## 2014-11-24 ENCOUNTER — Telehealth: Payer: Self-pay | Admitting: Adult Health

## 2014-11-24 ENCOUNTER — Encounter: Payer: Self-pay | Admitting: Adult Health

## 2014-11-24 VITALS — BP 132/80 | HR 80 | Temp 98.7°F | Ht 63.0 in | Wt 112.0 lb

## 2014-11-24 DIAGNOSIS — J8410 Pulmonary fibrosis, unspecified: Secondary | ICD-10-CM

## 2014-11-24 DIAGNOSIS — J209 Acute bronchitis, unspecified: Secondary | ICD-10-CM | POA: Diagnosis not present

## 2014-11-24 DIAGNOSIS — J40 Bronchitis, not specified as acute or chronic: Secondary | ICD-10-CM

## 2014-11-24 MED ORDER — AZITHROMYCIN 250 MG PO TABS: ORAL_TABLET | ORAL | Status: AC

## 2014-11-24 MED ORDER — TIOTROPIUM BROMIDE MONOHYDRATE 18 MCG IN CAPS: 18.0000 ug | ORAL_CAPSULE | Freq: Every day | RESPIRATORY_TRACT | Status: DC

## 2014-11-24 NOTE — Patient Instructions (Signed)
Begin Zpack take as directed .  Mucinex DM Twice daily  As needed  Cough/congestion (may use lower dose if needed)  Claritin 5mg  At bedtime  As needed  Drainage.  Saline nasal rinses As needed   May use Spiriva for next 2 weeks  Please contact office for sooner follow up if symptoms do not improve or worsen or seek emergency care  Follow up Dr. Joya Gaskins  In 2 weeks as planned .

## 2014-11-24 NOTE — Progress Notes (Signed)
   Subjective:    Patient ID: [name], female    DOB: 09-01-1930, 81 y.o.   MRN: [id number]  HPI 81 yo female never smoker  known pulmonary nodules c/w benign granulomas with CT chest stability over 2006 to 2011.  Pneumonia on right lung 2006 with clearing on CT chest    11/24/2014 Acute  Pt presents for an acute office visit.  Pt complains of  prod cough with clear thick mucus, wheezing, sore throat and right sided ear pain x 10 days. Has a lot of post nasal drainage . Not taking any otc meds. Appetite is good.  Denies any chest tightness/congestion, SOB, and fever/chills/nausea and vomiting.  She says she is prone to pneumonia in past .  No recent abx.  Wants refill of spiriva. She has used spiriva in past when she gets flare of cough , says this always helps.  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  Leaving for [facility] later this week.  Celebrated 60th weeding anniversary.  Denies chest pain, orthopnea, rash , edema , hemoptysis or fever.   Review of Systems Constitutional:   No  weight loss, night sweats,  Fevers, chills,  +fatigue, or  lassitude.  HEENT:   No headaches,  Difficulty swallowing,  Tooth/dental problems, or  Sore throat,                No sneezing, itching, ear ache, + nasal congestion, post nasal drip,   CV:  No chest pain,  Orthopnea, PND, swelling in lower extremities, anasarca, dizziness, palpitations, syncope.   GI  No heartburn, indigestion, abdominal pain, nausea, vomiting, diarrhea, change in bowel habits, loss of appetite, bloody stools.   Resp:   No chest wall deformity  Skin: no rash or lesions.  GU: no dysuria, change in color of urine, no urgency or frequency.  No flank pain, no hematuria   MS:  No joint pain or swelling.  No decreased range of motion.  No back pain.  Psych:  No change in mood or affect. No depression or anxiety.  No memory  loss.         Objective:   Physical Exam  GEN: A/Ox3; pleasant , NAD, well nourished   HEENT:  [place]/AT,  EACs-clear, TMs-wnl, NOSE-clear drainage , THROAT-clear, no lesions, no postnasal drip or exudate noted.   NECK:  Supple w/ fair ROM; no JVD; normal carotid impulses w/o bruits; no thyromegaly or nodules palpated; no lymphadenopathy.  RESP  Clear  P & A; w/o, wheezes/ rales/ or rhonchi.no accessory muscle use, no dullness to percussion  CARD:  RRR, no m/r/g  , no peripheral edema, pulses intact, no cyanosis or clubbing.  GI:   Soft & nt; nml bowel sounds; no organomegaly or masses detected.  Musco: Warm bil, no deformities or joint swelling noted.   Neuro: alert, no focal deficits noted.    Skin: Warm, no lesions or rashes        Assessment & Plan:

## 2014-11-24 NOTE — Assessment & Plan Note (Signed)
CT chest have been stable over the years dating back to 2006.  Check cxr today

## 2014-11-24 NOTE — Telephone Encounter (Signed)
Attempted to call pt back No answer and could not leave a message Will try to call pt back later

## 2014-11-24 NOTE — Assessment & Plan Note (Signed)
URI/Acute Bronchitis  Check cxr today   Plan  Begin Zpack take as directed .  Mucinex DM Twice daily  As needed  Cough/congestion (may use lower dose if needed)  Claritin 5mg  At bedtime  As needed  Drainage.  Saline nasal rinses As needed   May use Spiriva for next 2 weeks  Please contact office for sooner follow up if symptoms do not improve or worsen or seek emergency care  Follow up Dr. Joya Gaskins  In 2 weeks as planned .

## 2014-11-24 NOTE — Telephone Encounter (Signed)
Result Notes     Notes Recorded by Melvenia Needles, NP on 11/24/2014 at 11:26 AM No sign of PNA  No nodules seen on CXR today  Cont w/ ov recs  Pt aware cont w/ ov recs  Please contact office for sooner follow up if symptoms do not improve or worsen or seek emergency care  Left message with husband to call back for test results 11/24/2014  ---  I spoke with patient about results and she verbalized understanding and had no questions

## 2014-12-08 ENCOUNTER — Ambulatory Visit (INDEPENDENT_AMBULATORY_CARE_PROVIDER_SITE_OTHER): Payer: Medicare Other | Admitting: Critical Care Medicine

## 2014-12-08 ENCOUNTER — Encounter: Payer: Self-pay | Admitting: Critical Care Medicine

## 2014-12-08 VITALS — BP 130/78 | HR 74 | Ht 63.0 in | Wt 113.4 lb

## 2014-12-08 DIAGNOSIS — J418 Mixed simple and mucopurulent chronic bronchitis: Secondary | ICD-10-CM

## 2014-12-08 DIAGNOSIS — J449 Chronic obstructive pulmonary disease, unspecified: Secondary | ICD-10-CM

## 2014-12-08 DIAGNOSIS — D495 Neoplasm of unspecified behavior of other genitourinary organs: Secondary | ICD-10-CM

## 2014-12-08 DIAGNOSIS — D49519 Neoplasm of unspecified behavior of unspecified kidney: Secondary | ICD-10-CM

## 2014-12-08 DIAGNOSIS — J84112 Idiopathic pulmonary fibrosis: Secondary | ICD-10-CM

## 2014-12-08 DIAGNOSIS — Z23 Encounter for immunization: Secondary | ICD-10-CM

## 2014-12-08 NOTE — Patient Instructions (Signed)
No change in medications. Return in         6 months Flu vaccine

## 2014-12-08 NOTE — Progress Notes (Signed)
Subjective:    Patient ID: [name], female    DOB: 10-Feb-1931, 81 y.o.   MRN: [id number]  HPI 12/10/2014 Chief Complaint  Patient presents with  . Follow-up    pt states she is doing well.pt states she always has the dry cough. pt has no concerns at this time.  Pt doing well.  No new issues.  Cough is dry.  Pt saw np recently and rx zpak and claritin and has helped.  Also likes the spiriva Pt denies any significant sore throat, nasal congestion or excess secretions, fever, chills, sweats, unintended weight loss, pleurtic or exertional chest pain, orthopnea PND, or leg swelling Pt denies any increase in rescue therapy over baseline, denies waking up needing it or having any early am or nocturnal exacerbations of coughing/wheezing/or dyspnea. Pt also denies any obvious fluctuation in symptoms with  weather or environmental change or other alleviating or aggravating factors   Current Medications, Allergies, Complete Past Medical History, Past Surgical History, Family History, and Social History were reviewed in Section record per todays encounter:  12/10/2014  Review of Systems  Constitutional: Negative.   HENT: Negative.  Negative for ear pain, postnasal drip, rhinorrhea, sinus pressure, sore throat, trouble swallowing and voice change.   Eyes: Negative.   Respiratory: Positive for cough. Negative for apnea, choking, chest tightness, shortness of breath, wheezing and stridor.   Cardiovascular: Negative.  Negative for chest pain, palpitations and leg swelling.  Gastrointestinal: Negative.  Negative for nausea, vomiting, abdominal pain and abdominal distention.  Genitourinary: Negative.   Musculoskeletal: Negative.  Negative for myalgias and arthralgias.  Skin: Negative.  Negative for rash.  Allergic/Immunologic: Negative.  Negative for environmental allergies and food allergies.  Neurological: Negative.  Negative for dizziness, syncope, weakness and  headaches.  Hematological: Negative.  Negative for adenopathy. Does not bruise/bleed easily.  Psychiatric/Behavioral: Negative.  Negative for sleep disturbance and agitation. The patient is not nervous/anxious.        Objective:   Physical Exam Filed Vitals:   12/08/14 1047  BP: 130/78  Pulse: 74  Height: 5\' 3"  (1.6 m)  Weight: 113 lb 6.4 oz (51.438 kg)  SpO2: 100%    Gen: Pleasant, well-nourished, in no distress,  normal affect  ENT: No lesions,  mouth clear,  oropharynx clear, no postnasal drip  Neck: No JVD, no TMG, no carotid bruits  Lungs: No use of accessory muscles, no dullness to percussion, distant breath sounds  Cardiovascular: RRR, heart sounds normal, no murmur or gallops, no peripheral edema  Abdomen: soft and NT, no HSM,  BS normal  Musculoskeletal: No deformities, no cyanosis or clubbing  Neuro: alert, non focal  Skin: Warm, no lesions or rashes  No results found.        Assessment & Plan:  I personally reviewed all images and lab data in [facility] as well as any outside material available during this office visit and agree with the  radiology impressions.   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

## 2014-12-10 NOTE — Assessment & Plan Note (Signed)
Gold stage B COPD with chronic airway obstruction as documented 2014 improved on Spiriva Previous history of bronchiolitis obliterans organized pneumonia right lower lobe now resolved No oxygenation failure Plan Administer flu vaccine Continue Spiriva and Flonase with Claritin Return 6 months

## 2014-12-10 NOTE — Assessment & Plan Note (Signed)
Note patient underwent resection in July 2016 for an oncocytoma of the right kidney

## 2015-01-17 ENCOUNTER — Other Ambulatory Visit: Payer: Self-pay | Admitting: Adult Health

## 2015-06-06 ENCOUNTER — Ambulatory Visit (INDEPENDENT_AMBULATORY_CARE_PROVIDER_SITE_OTHER): Payer: Medicare Other | Admitting: Pulmonary Disease

## 2015-06-06 ENCOUNTER — Encounter: Payer: Self-pay | Admitting: Pulmonary Disease

## 2015-06-06 ENCOUNTER — Other Ambulatory Visit: Payer: Medicare Other

## 2015-06-06 VITALS — BP 138/80 | HR 82 | Ht 63.0 in | Wt 120.6 lb

## 2015-06-06 DIAGNOSIS — K219 Gastro-esophageal reflux disease without esophagitis: Secondary | ICD-10-CM | POA: Diagnosis not present

## 2015-06-06 DIAGNOSIS — J449 Chronic obstructive pulmonary disease, unspecified: Secondary | ICD-10-CM | POA: Diagnosis not present

## 2015-06-06 DIAGNOSIS — R059 Cough, unspecified: Secondary | ICD-10-CM

## 2015-06-06 DIAGNOSIS — R05 Cough: Secondary | ICD-10-CM

## 2015-06-06 DIAGNOSIS — Z23 Encounter for immunization: Secondary | ICD-10-CM | POA: Diagnosis not present

## 2015-06-06 NOTE — Progress Notes (Signed)
Subjective:    Patient ID: [name], female    DOB: 02-19-1931, 82 y.o.   MRN: [id number]  C.C.:  Follow-up for Mild COPD & GERD.  HPI Mild COPD:  Reports she is periods where she doesn't use her Spiriva. Denies any wheezing. She has had an abnormal sensation in her throat. She reports she has a chronic cough at baseline. She reports she has a new cough different from her baseline that seems to originate from this sensation in her throat. Cough is intermittently productive of a white mucus. She denies any dyspnea.   GERD:  Currently on a probiotic only. Not currently on medication. Denies any reflux or dyspepsia. Denies any morning brash water taste but does have a dry mouth.    Review of Systems She reports worse post-nasal drainage than usual. Doesn't have significant sinus congestion or pressure. No fever, chills, or sweats. No chest pain or pressure.   Allergies  Allergen Reactions  . Tramadol Hcl Nausea And Vomiting  . Codeine     Hallucinations     Current Outpatient Prescriptions on File Prior to Visit  Medication Sig Dispense Refill  . acetaminophen (TYLENOL) 325 MG tablet Take 325 mg by mouth every 6 (six) hours as needed.    . Cholecalciferol (VITAMIN D-3) 1000 UNITS CAPS Take 1 capsule by mouth daily.    . fluticasone (FLONASE) 50 MCG/ACT nasal spray Place 2 sprays into the nose daily as needed for allergies or rhinitis.     Marland Kitchen LORazepam (ATIVAN) 0.5 MG tablet Take 0.5 mg by mouth at bedtime.     . Multiple Vitamin (MULTIVITAMIN) tablet Take 1 tablet by mouth daily.    . naphazoline-glycerin (CLEAR EYES) 0.012-0.2 % SOLN Place 1-2 drops into both eyes every 4 (four) hours as needed for irritation.    Marland Kitchen olmesartan (BENICAR) 5 MG tablet Take 5-10 mg by mouth every morning.     . Psyllium (METAMUCIL) 28.3 % POWD Take by mouth as needed.    Marland Kitchen SPIRIVA HANDIHALER 18 MCG inhalation capsule PLACE 1 CAPSULE (18 MCG TOTAL) INTO INHALER AND INHALE DAILY. 30 capsule 5   No  current facility-administered medications on file prior to visit.    Past Medical History  Diagnosis Date  . Hypertension   . Pulmonary nodules   . BOOP (bronchiolitis obliterans with organizing pneumonia) ([place])   . Claustrophobia   . Arthritis   . Dysrhythmia   . Bronchitis     hx of   . Chronic kidney disease   . PONV (postoperative nausea and vomiting)     Past Surgical History  Procedure Laterality Date  . Tonsillectomy    . Appendectomy    . Cholecystectomy    . Hip surgery      partial replacement of left hip   . Meniscus tear surgery in right knee     . Cataract surgery       bilateral   . Cystoscopy with retrograde pyelogram, ureteroscopy and stent placement Right 08/20/2014    Procedure: CYSTOSCOPY WITH RIGHT RETROGRADE PYELOGRAM, RIGHT DIAGNOSTIC URETEROSCOPY, RIGHT URETERAL STENT PLACEMENT;  Surgeon: Alexis Frock, MD;  Location: [facility];  Service: Urology;  Laterality: Right;  . Robot assisted laparoscopic nephrectomy Right 10/01/2014    Procedure: ROBOTIC ASSISTED LAPAROSCOPIC NEPHRECTOMY LYSIS OF ADHESIONS;  Surgeon: Alexis Frock, MD;  Location: [facility];  Service: Urology;  Laterality: Right;    Family History  Problem Relation Age of Onset  . Hypertension Mother   .  Cancer Mother   . Heart failure Father   . Emphysema Father   . Prostate cancer Brother     Social History   Social History  . Marital Status: Married    Spouse Name: N/A  . Number of Children: N/A  . Years of Education: N/A   Social History Main Topics  . Smoking status: Passive Smoke Exposure - Never Smoker  . Smokeless tobacco: Never Used     Comment: From father  . Alcohol Use: 0.0 oz/week    0 Standard drinks or equivalent per week     Comment: occasional glass of wine   . Drug Use: No  . Sexual Activity: Not Asked   Other Topics Concern  . None   Social History Narrative   Patient is originally from [place]. She has always lived in [place]. She has prior travel to [place], [place],  [place], & [place]. 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 Remote canary exposure as a child. No mold exposure. No asbestos exposure.       Objective:   Physical Exam BP 138/80 mmHg  Pulse 82  Ht 5\' 3"  (1.6 m)  Wt 120 lb 9.6 oz (54.704 kg)  BMI 21.37 kg/m2  SpO2 99% General:  Awake. Alert. No acute distress. Elderly female. Integument:  Warm & dry. No rash on exposed skin.  HEENT:  Moist mucus membranes. No oral ulcers. No scleral injection or icterus. Moderate bilateral nasal turbinate swelling. Cardiovascular:  Regular rate. No edema. No appreciable JVD.  Pulmonary:  Good aeration & clear to auscultation bilaterally. Symmetric chest wall expansion. No accessory muscle use on room air. Abdomen: Soft. Normal bowel sounds. Nondistended.  Musculoskeletal:  Normal bulk and tone. No joint deformity or effusion appreciated.  PFT 07/21/12: FVC 2.25 L (89%) FEV1 1.45 L (78%) FEV1/FVC 0.64 FEF 25-75 0.77 L (53%)  IMAGING CXR PA/LAT 11/24/14 (personally reviewed by me): No nodule or opacity appreciated. No pleural effusion. Hyperinflation with flattening of the diaphragms and barreling of the chest. Heart normal in size. Mediastinum normal in contour.  LABS 10/03/14 CBC: 8.5/7.4/34.7/217 BMP: 135/3.9/103/29/13/1.24/93/8.4    Assessment & Plan:  82 year old female withUnderlying mild COPD. Significant prior exposure to secondhand tobacco smoke. Patient's ongoing intermittent cough is likely due to her postnasal drainage likely from her allergic rhinitis. She is asymptomatic with regards to reflux which I feel is not contributing unless possibly silent laryngo-esophageal reflux is present. Minimal symptomatic benefit from Spiriva and I feel it's reasonable to repeat on a function testing at her next appointment given her prior mild COPD could simply be physiologic aging. I reviewed her prior chest x-ray which shows no evidence for focal opacity or effusion. I instructed the patient  contact my office if she had any new breathing problems before next appointment.  1. Mild COPD:  Patient counseled to use Spiriva as prescribed. Screening for alpha-1 antitrypsin deficiency. Checking for pulmonary function testing at next appointment.  2. Cough:  Suspect due to postnasal drainage. Recommended empiric over-the-counter antihistamine therapy in addition to Flonase. If cough persists plan to evaluate for underlying silent laryngo-esophageal reflux.   3. GERD:  Holding off on barium swallow at this time. If cough persists plan to order a barium swallow.  4. Health Maintenance: Previously received influenzae September 2016 & Prevnar November 2014. Pneumovax 23 today.  5. Follow-up:Return to clinic in 3 months or sooner if needed.  Sonia Baller Ashok Cordia, M.D. [facility] Pulmonary & Critical Care Pager:  [phone] After  3pm or if no response, call [phone] 3:08 PM 06/06/2015

## 2015-06-06 NOTE — Patient Instructions (Addendum)
   Continue using your inhaler as prescribed.  We will do a breathing test at your next appointment  You can try using Claritin (Loratadine), Zyrtec (Cetirizine), or Allegra (Fexophenadine) to help with your sinus drainage & cough. Call me if this doesn't help & your drainage is dried up.  I will see you back in 3 months or sooner if needed.  TESTS ORDERED: 1. Alpha-1 Antitrypsin Phenotype today 2. Full pulmonary function testing at next appointment

## 2015-06-07 ENCOUNTER — Telehealth: Payer: Self-pay | Admitting: Pulmonary Disease

## 2015-06-07 NOTE — Telephone Encounter (Signed)
Suspect her body is mounting an excessive immune response to the shot. She can continue to take Tylenol as needed for the body aches. If she develops any rash or redness she should notify me and/or be seen. If she has any swelling in her lips, face or eyes she should be seen. If she develops any trouble breathing or swallowing she should be seen immediately.  JN

## 2015-06-07 NOTE — Telephone Encounter (Signed)
Called spoke with pt. Made aware of below. She verbalized understanding and needed nothing further

## 2015-06-07 NOTE — Telephone Encounter (Signed)
Spoke with pt's husband.  He states that he thinks pt may have had a reaction to pneumonia shot that was given at [facility] yesterday.  C/o entire arm hurting, body hurts all over, feels sick all over, mild warmness at injection site.  Denies swelling or redness at site.  Denies fever, chills or sweats.  Took Tylenol last night and an antihistamine this am and had some relief.  Please advise on further recommendations.

## 2015-06-11 LAB — ALPHA-1 ANTITRYPSIN PHENOTYPE: A-1 Antitrypsin: 132 mg/dL (ref 83–199)

## 2015-08-26 ENCOUNTER — Other Ambulatory Visit: Payer: Self-pay | Admitting: Family Medicine

## 2015-08-26 DIAGNOSIS — G8929 Other chronic pain: Secondary | ICD-10-CM

## 2015-08-26 DIAGNOSIS — M5442 Lumbago with sciatica, left side: Secondary | ICD-10-CM

## 2015-09-04 ENCOUNTER — Ambulatory Visit
Admission: RE | Admit: 2015-09-04 | Discharge: 2015-09-04 | Disposition: A | Discharge status: Home / Self Care | Payer: Medicare Other | Source: Ambulatory Visit | Attending: Family Medicine | Admitting: Family Medicine

## 2015-09-04 DIAGNOSIS — G8929 Other chronic pain: Secondary | ICD-10-CM

## 2015-09-04 DIAGNOSIS — M5442 Lumbago with sciatica, left side: Secondary | ICD-10-CM

## 2015-09-12 ENCOUNTER — Ambulatory Visit (INDEPENDENT_AMBULATORY_CARE_PROVIDER_SITE_OTHER): Payer: Medicare Other | Admitting: Pulmonary Disease

## 2015-09-12 ENCOUNTER — Encounter: Payer: Self-pay | Admitting: Pulmonary Disease

## 2015-09-12 VITALS — BP 116/78 | HR 80 | Ht 62.25 in | Wt 116.0 lb

## 2015-09-12 DIAGNOSIS — J309 Allergic rhinitis, unspecified: Secondary | ICD-10-CM | POA: Diagnosis not present

## 2015-09-12 DIAGNOSIS — J449 Chronic obstructive pulmonary disease, unspecified: Secondary | ICD-10-CM

## 2015-09-12 DIAGNOSIS — K219 Gastro-esophageal reflux disease without esophagitis: Secondary | ICD-10-CM

## 2015-09-12 LAB — PULMONARY FUNCTION TEST
DL/VA % PRED: 98 %
DL/VA: 4.5 ml/min/mmHg/L
DLCO UNC % PRED: 76 %
DLCO cor % pred: 79 %
DLCO cor: 17.31 ml/min/mmHg
DLCO unc: 16.69 ml/min/mmHg
FEF 25-75 PRE: 1.01 L/s
FEF 25-75 Post: 1.07 L/sec
FEF2575-%CHANGE-POST: 6 %
FEF2575-%PRED-POST: 93 %
FEF2575-%Pred-Pre: 88 %
FEV1-%CHANGE-POST: 2 %
FEV1-%PRED-PRE: 92 %
FEV1-%Pred-Post: 94 %
FEV1-PRE: 1.54 L
FEV1-Post: 1.57 L
FEV1FVC-%CHANGE-POST: 5 %
FEV1FVC-%PRED-PRE: 98 %
FEV6-%Change-Post: -2 %
FEV6-%PRED-POST: 97 %
FEV6-%Pred-Pre: 100 %
FEV6-PRE: 2.13 L
FEV6-Post: 2.07 L
FEV6FVC-%PRED-PRE: 106 %
FEV6FVC-%Pred-Post: 106 %
FVC-%Change-Post: -2 %
FVC-%PRED-POST: 91 %
FVC-%PRED-PRE: 94 %
FVC-POST: 2.07 L
FVC-PRE: 2.13 L
POST FEV1/FVC RATIO: 76 %
PRE FEV6/FVC RATIO: 100 %
Post FEV6/FVC ratio: 100 %
Pre FEV1/FVC ratio: 72 %
RV % PRED: 116 %
RV: 2.75 L
TLC % pred: 100 %
TLC: 4.82 L

## 2015-09-12 NOTE — Progress Notes (Signed)
Subjective:    Patient ID: [name], female    DOB: 08/08/1930, 82 y.o.   MRN: [id number]  C.C.:  Follow-up for Mild COPD, Allergic Rhinitis, & GERD.  HPI Mild COPD:  Suspect more of an asthmatic phenotype. She continues to have an intermittent cough that is not worsening. She denies any chest congestion. She reports a "tickling" in her throat. She reports her cough is rarely productive, mostly in the morning. Denies any wheezing. Compliant with Spiriva and feels it is significantly helping. No exacerbations since last appointment.  Allergic Rhinitis:  She reports she has significant reduction in her post-nasal drainage with Flonase. Also taking Claritin as needed. No sinus congestion or pressure.   GERD:  Denies any reflux, dyspepsia, or morning brash water taste. No dysphagia or odynophagia.    Review of Systems No chest pain or pressure. She is continuing to have chronic back pain. She reports diffuse pain in her knees as well. No fever, chills, or sweats.   Allergies  Allergen Reactions  . Tramadol Hcl Nausea And Vomiting  . Codeine     Hallucinations     Current Outpatient Prescriptions on File Prior to Visit  Medication Sig Dispense Refill  . acetaminophen (TYLENOL) 325 MG tablet Take 325 mg by mouth every 6 (six) hours as needed.    . Cholecalciferol (VITAMIN D-3) 1000 UNITS CAPS Take 1 capsule by mouth daily.    . fluticasone (FLONASE) 50 MCG/ACT nasal spray Place 2 sprays into the nose daily as needed for allergies or rhinitis.     Marland Kitchen LORazepam (ATIVAN) 0.5 MG tablet Take 0.5 mg by mouth at bedtime.     . Multiple Vitamin (MULTIVITAMIN) tablet Take 1 tablet by mouth daily.    . naphazoline-glycerin (CLEAR EYES) 0.012-0.2 % SOLN Place 1-2 drops into both eyes every 4 (four) hours as needed for irritation.    Marland Kitchen olmesartan (BENICAR) 5 MG tablet Take 5-10 mg by mouth every morning.     . Psyllium (METAMUCIL) 28.3 % POWD Take by mouth as needed.    Marland Kitchen SPIRIVA  HANDIHALER 18 MCG inhalation capsule PLACE 1 CAPSULE (18 MCG TOTAL) INTO INHALER AND INHALE DAILY. 30 capsule 5   No current facility-administered medications on file prior to visit.    Past Medical History  Diagnosis Date  . Hypertension   . Pulmonary nodules   . BOOP (bronchiolitis obliterans with organizing pneumonia) ([place])   . Claustrophobia   . Arthritis   . Dysrhythmia   . Bronchitis     hx of   . Chronic kidney disease   . PONV (postoperative nausea and vomiting)     Past Surgical History  Procedure Laterality Date  . Tonsillectomy    . Appendectomy    . Cholecystectomy    . Hip surgery      partial replacement of left hip   . Meniscus tear surgery in right knee     . Cataract surgery       bilateral   . Cystoscopy with retrograde pyelogram, ureteroscopy and stent placement Right 08/20/2014    Procedure: CYSTOSCOPY WITH RIGHT RETROGRADE PYELOGRAM, RIGHT DIAGNOSTIC URETEROSCOPY, RIGHT URETERAL STENT PLACEMENT;  Surgeon: Alexis Frock, MD;  Location: [facility];  Service: Urology;  Laterality: Right;  . Robot assisted laparoscopic nephrectomy Right 10/01/2014    Procedure: ROBOTIC ASSISTED LAPAROSCOPIC NEPHRECTOMY LYSIS OF ADHESIONS;  Surgeon: Alexis Frock, MD;  Location: [facility];  Service: Urology;  Laterality: Right;    Family History  Problem  Relation Age of Onset  . Hypertension Mother   . Cancer Mother   . Heart failure Father   . Emphysema Father   . Prostate cancer Brother     Social History   Social History  . Marital Status: Married    Spouse Name: N/A  . Number of Children: N/A  . Years of Education: N/A   Social History Main Topics  . Smoking status: Passive Smoke Exposure - Never Smoker  . Smokeless tobacco: Never Used     Comment: From father  . Alcohol Use: 0.0 oz/week    0 Standard drinks or equivalent per week     Comment: occasional glass of wine   . Drug Use: No  . Sexual Activity: Not Asked   Other Topics Concern  . None   Social  History Narrative   Patient is originally from [place]. She has always lived in [place]. She has prior travel to [place], [place], [place], & [place]. 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 Remote canary exposure as a child. No mold exposure. No asbestos exposure.       Objective:   Physical Exam BP 116/78 mmHg  Pulse 80  Ht 5' 2.25" (1.581 m)  Wt 116 lb (52.617 kg)  BMI 21.05 kg/m2  SpO2 94% General:  Awake. Alert. No distress.  Integument:  Warm & dry. No rash on exposed skin.  HEENT:  Moist mucus membranes. No oral ulcers. No scleral injection. Minimal bilateral nasal turbinate swelling. Cardiovascular:  Regular rate. No edema. Normal S1 & S2. Pulmonary:  Clear bilaterally to auscultation. Speaking in complete sentences. Normal work of breathing on room air. Abdomen: Soft. Normal bowel sounds. Nondistended.   PFT 09/12/15: FVC 2.13 L (94%) FEV1 1.54 L (92%) FEV1/FVC 0.72 FEF 25-75 1.01 L (80%) no bronchodilator response TLC 4.18 L (100%) RV 116% ERV 169% DLCO corrected 79% (hemoglobin 12.3) 07/21/12: FVC 2.25 L (89%) FEV1 1.45 L (78%) FEV1/FVC 0.64 FEF 25-75 0.77 L (53%)  IMAGING CXR PA/LAT 11/24/14 (previously reviewed by me): No nodule or opacity appreciated. No pleural effusion. Hyperinflation with flattening of the diaphragms and barreling of the chest. Heart normal in size. Mediastinum normal in contour.  LABS 06/06/15 Alpha-1 antitrypsin: MM (132)  10/03/14 CBC: 8.5/7.4/34.7/217 BMP: 135/3.9/103/29/13/1.24/93/8.4    Assessment & Plan:  82 year old female with underlying mild COPD but I suspect that she has more of an asthmatic phenotype. Allergic rhinitis seems to be mildly improved with Flonase therefore we will be continuing this medication. I reviewed her pulmonary function testing today which shows normal spirometry, lung volumes, and carbon monoxide diffusion capacity. I instructed the patient to notify my office if she had any new breathing problems before her  next appointment as I would be happy to see her sooner.  1. Mild COPD:  Suspect more of an asthmatic phenotype. Continuing Spiriva. No changes. 2. Allergic Rhinitis: Continuing patient on Claritin & Flonase. No changes. 3. GERD:   Remains asymptomatic off medication. No new medications at this time. 4. Health Maintenance: Previously received influenzae September 2016, Prevnar November 2014, & Pneumovax March 2017. 5. Follow-up:  Return to clinic in 6 months or sooner if needed.  Sonia Baller Ashok Cordia, M.D. [facility] Pulmonary & Critical Care Pager:  [phone] After 3pm or if no response, call [phone] 10:49 AM 09/12/2015

## 2015-09-12 NOTE — Patient Instructions (Signed)
   Continue taking your Spiriva as prescribed.  Continue taking your Flonase as prescribed.  Call me if you have any new breathing problems before your next appointment in 6 months.

## 2015-09-12 NOTE — Progress Notes (Signed)
PFT done today. 

## 2015-11-30 ENCOUNTER — Ambulatory Visit (INDEPENDENT_AMBULATORY_CARE_PROVIDER_SITE_OTHER): Payer: Medicare Other | Admitting: Podiatry

## 2015-11-30 ENCOUNTER — Ambulatory Visit (INDEPENDENT_AMBULATORY_CARE_PROVIDER_SITE_OTHER): Payer: Medicare Other

## 2015-11-30 ENCOUNTER — Encounter: Payer: Self-pay | Admitting: Podiatry

## 2015-11-30 ENCOUNTER — Ambulatory Visit: Payer: Self-pay

## 2015-11-30 VITALS — BP 158/95 | HR 81 | Resp 16

## 2015-11-30 DIAGNOSIS — M779 Enthesopathy, unspecified: Secondary | ICD-10-CM

## 2015-11-30 DIAGNOSIS — M25571 Pain in right ankle and joints of right foot: Secondary | ICD-10-CM

## 2015-11-30 DIAGNOSIS — M79671 Pain in right foot: Secondary | ICD-10-CM

## 2015-11-30 DIAGNOSIS — M2011 Hallux valgus (acquired), right foot: Secondary | ICD-10-CM | POA: Diagnosis not present

## 2015-11-30 MED ORDER — TRIAMCINOLONE ACETONIDE 10 MG/ML IJ SUSP
10.0000 mg | Freq: Once | INTRAMUSCULAR | Status: AC
  Administered 2015-11-30: 10 mg

## 2015-12-01 NOTE — Progress Notes (Signed)
Subjective:     Patient ID: [name], female   DOB: [birth year], 82 y.o.   MRN: [id number]  HPI patient presents with pain in the medial side of the right ankle without remembering specific injury   Review of Systems     Objective:   Physical Exam Neurovascular status unchanged with negative Homans sign and pain and fluid buildup around the medial side of the right Achilles with no central or lateral tendon involvement and no indications of tendon dysfunction    Assessment:     Probable inflammatory condition with no indications of cellulitic event    Plan:     Careful medial injection administered 3 mg dexamethasone Kenalog 5 mg Xylocaine after reviewing with patient possibility for rupture associated with it. Begin heat ice therapy reduced activity and utilization of heel lifts. Reappoint 2 weeks or earlier if any issues should occur  X-rays indicated spur with no signs stress fracture

## 2015-12-08 ENCOUNTER — Ambulatory Visit (INDEPENDENT_AMBULATORY_CARE_PROVIDER_SITE_OTHER): Payer: Medicare Other | Admitting: Podiatry

## 2015-12-08 ENCOUNTER — Encounter: Payer: Self-pay | Admitting: Podiatry

## 2015-12-08 DIAGNOSIS — M25571 Pain in right ankle and joints of right foot: Secondary | ICD-10-CM

## 2015-12-08 DIAGNOSIS — M779 Enthesopathy, unspecified: Secondary | ICD-10-CM

## 2015-12-23 NOTE — Progress Notes (Signed)
Subjective:     Patient ID: [name], female   DOB: 04-11-1930, 82 y.o.   MRN: [id number]  HPI patient presents stating that her heel is feeling better but still gets tender at times   Review of Systems     Objective:   Physical Exam Neurovascular status intact with inflammation plantar heel that's improved but is still present    Assessment:     Plantar fasciitis improved but still present    Plan:     Spent a great of time going over anti-inflammatories and discussing physical therapy and utilizing supportive shoes. Patient will be seen back to recheck

## 2016-04-09 ENCOUNTER — Other Ambulatory Visit: Payer: Self-pay | Admitting: Adult Health

## 2016-04-11 ENCOUNTER — Ambulatory Visit: Payer: Medicare Other | Admitting: Pulmonary Disease

## 2016-05-04 ENCOUNTER — Ambulatory Visit (INDEPENDENT_AMBULATORY_CARE_PROVIDER_SITE_OTHER): Payer: Medicare Other | Admitting: Pulmonary Disease

## 2016-05-04 ENCOUNTER — Encounter: Payer: Self-pay | Admitting: Pulmonary Disease

## 2016-05-04 VITALS — BP 152/98 | HR 80 | Ht 62.25 in | Wt 120.2 lb

## 2016-05-04 DIAGNOSIS — J309 Allergic rhinitis, unspecified: Secondary | ICD-10-CM

## 2016-05-04 DIAGNOSIS — R1011 Right upper quadrant pain: Secondary | ICD-10-CM | POA: Diagnosis not present

## 2016-05-04 DIAGNOSIS — J449 Chronic obstructive pulmonary disease, unspecified: Secondary | ICD-10-CM

## 2016-05-04 DIAGNOSIS — K219 Gastro-esophageal reflux disease without esophagitis: Secondary | ICD-10-CM

## 2016-05-04 NOTE — Patient Instructions (Addendum)
   Try going off your Spiriva for a couple of days to see if this helps your dry mouth and affects your cough.  We will set you up for a CT of your chest and ultrasound of your abdomen to check about this pain you are having.  You will have a breathing test when I see you back in a few weeks.   Call me if you have any new breathing problems or questions.  TESTS ORDERED: 1. RUQ U/S 2. CT CHEST W/O 3. Spirometry with bronchodilator challenge at next appointment

## 2016-05-04 NOTE — Progress Notes (Signed)
Subjective:    Patient ID: [name], female    DOB: March 11, 1931, 83 y.o.   MRN: [id number]  C.C.:  Follow-up for Mild COPD, Chronic Allergic Rhinitis, & GERD.  HPI Mild COPD:  Suspect the patient has more of an asthmatic phenotype. Currently only on Spiriva. She reports her breathing is doing "fine". No exacerbations or episodes of bronchitis since last appointment. No wheezing. Does have a chronic cough that is intermittently productive of a clear mucus. She hasn't been exercising with her back pain.   Chronic Allergic Rhinitis:  Currently prescribed Flonase. Previously using Claritin as needed. She reports no new sinus congestion or pressure. She reports chronic post-nasal drainage at her baseline.   GERD:  Not currently on medication. No reflux or dyspepsia. No morning brash water taste.    Review of Systems She reports she has had abdominal discomfort in her right upper quadrant. She has also noticed a long of discomfort in her back as well and has been seen a pain specialist. Her pain is intermittent. She denies any fever, chills, sweats, or weight loss. She has actually been gaining some weight. No nausea or emesis. No adenopathy in her neck, groin, or axilla. No dysphagia or odynophagia. She reports she will sometimes get "choked swallowing" but this is rare. No rashes or abnormal bruising.   Allergies  Allergen Reactions  . Tramadol Hcl Nausea And Vomiting  . Codeine     Hallucinations   . Doxycycline Other (See Comments)    Upsets stomach and makes feel uncomfortable    Current Outpatient Prescriptions on File Prior to Visit  Medication Sig Dispense Refill  . acetaminophen (TYLENOL) 325 MG tablet Take 325 mg by mouth every 6 (six) hours as needed.    . Cholecalciferol (VITAMIN D-3) 1000 UNITS CAPS Take 1 capsule by mouth daily.    . fluticasone (FLONASE) 50 MCG/ACT nasal spray Place 2 sprays into the nose daily as needed for allergies or rhinitis.     Marland Kitchen loratadine  (CLARITIN) 10 MG tablet Take 10 mg by mouth daily as needed for allergies.    Marland Kitchen LORazepam (ATIVAN) 0.5 MG tablet Take 0.5 mg by mouth at bedtime.     . naphazoline-glycerin (CLEAR EYES) 0.012-0.2 % SOLN Place 1-2 drops into both eyes every 4 (four) hours as needed for irritation.    Marland Kitchen olmesartan (BENICAR) 5 MG tablet Take 5-10 mg by mouth every morning. Pt takes 2 tablets a day    . Psyllium (METAMUCIL) 28.3 % POWD Take by mouth as needed.    Marland Kitchen SPIRIVA HANDIHALER 18 MCG inhalation capsule PLACE 1 CAPSULE (18 MCG TOTAL) INTO INHALER AND INHALE DAILY. 30 capsule 2  . Multiple Vitamin (MULTIVITAMIN) tablet Take 1 tablet by mouth daily.     No current facility-administered medications on file prior to visit.     Past Medical History:  Diagnosis Date  . Arthritis   . BOOP (bronchiolitis obliterans with organizing pneumonia) ([place])   . Bronchitis    hx of   . Chronic kidney disease   . Claustrophobia   . Dysrhythmia   . Hypertension   . PONV (postoperative nausea and vomiting)   . Pulmonary nodules     Past Surgical History:  Procedure Laterality Date  . APPENDECTOMY    . cataract surgery      bilateral   . CHOLECYSTECTOMY    . CYSTOSCOPY WITH RETROGRADE PYELOGRAM, URETEROSCOPY AND STENT PLACEMENT Right 08/20/2014   Procedure: CYSTOSCOPY WITH RIGHT RETROGRADE  PYELOGRAM, RIGHT DIAGNOSTIC URETEROSCOPY, RIGHT URETERAL STENT PLACEMENT;  Surgeon: Alexis Frock, MD;  Location: [facility];  Service: Urology;  Laterality: Right;  . HIP SURGERY     partial replacement of left hip   . meniscus tear surgery in right knee     . ROBOT ASSISTED LAPAROSCOPIC NEPHRECTOMY Right 10/01/2014   Procedure: ROBOTIC ASSISTED LAPAROSCOPIC NEPHRECTOMY LYSIS OF ADHESIONS;  Surgeon: Alexis Frock, MD;  Location: [facility];  Service: Urology;  Laterality: Right;  . TONSILLECTOMY      Family History  Problem Relation Age of Onset  . Hypertension Mother   . Cancer Mother   . Heart failure Father   . Emphysema Father    . Prostate cancer Brother     Social History   Social History  . Marital status: Married    Spouse name: N/A  . Number of children: N/A  . Years of education: N/A   Social History Main Topics  . Smoking status: Passive Smoke Exposure - Never Smoker  . Smokeless tobacco: Never Used     Comment: From father  . Alcohol use 0.0 oz/week     Comment: occasional glass of wine   . Drug use: No  . Sexual activity: Not Asked   Other Topics Concern  . None   Social History Narrative   Patient is originally from [place]. She has always lived in [place]. She has prior travel to [place], [place], [place], & [place]. 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 Remote canary exposure as a child. No mold exposure. No asbestos exposure.       Objective:   Physical Exam BP (!) 152/98 (BP Location: Right Arm, Patient Position: Sitting, Cuff Size: Normal)   Pulse 80   Ht 5' 2.25" (1.581 m)   Wt 120 lb 3.2 oz (54.5 kg)   SpO2 96%   BMI 21.81 kg/m   General:  Awake. No distress. Thin female. Integument:  Warm & dry. No rash on exposed skin. No bruising on exposed skin.  Extremities:  No cyanosis or clubbing.  HEENT:  Moist mucus membranes. No oral ulcers. No scleral injection or icterus. Mild bilateral nasal turbinate swelling right greater than left. Cardiovascular:  Regular rate and rhythm. No edema. No appreciable JVD.  Pulmonary:  Good aeration & clear to auscultation bilaterally. Symmetric chest wall expansion. No accessory muscle use on room air. Abdomen: Soft. Normal bowel sounds. Nondistended. No hepatosplenomegaly. No tenderness to palpation of abdomen or flank tenderness. Musculoskeletal:  Normal bulk and tone. Hand grip strength 5/5 bilaterally. No joint deformity or effusion appreciated. No chest wall deformity or tenderness to palpation of chest wall. No tenderness to palpation of thoracic spine.  PFT 09/12/15: FVC 2.13 L (94%) FEV1 1.54 L (92%) FEV1/FVC 0.72 FEF 25-75 1.01 L  (80%) no bronchodilator response TLC 4.18 L (100%) RV 116% ERV 169% DLCO corrected 79% (hemoglobin 12.3) 07/21/12: FVC 2.25 L (89%) FEV1 1.45 L (78%) FEV1/FVC 0.64 FEF 25-75 0.77 L (53%)  IMAGING CXR PA/LAT 11/24/14 (previously reviewed by me): No nodule or opacity appreciated. No pleural effusion. Hyperinflation with flattening of the diaphragms and barreling of the chest. Heart normal in size. Mediastinum normal in contour.  LABS 06/06/15 Alpha-1 antitrypsin: MM (132)  10/03/14 CBC: 8.5/7.4/34.7/217 BMP: 135/3.9/103/29/13/1.24/93/8.4    Assessment & Plan:  83 y.o. female with underlying mild COPD, chronic allergic rhinitis, & GERD. female with underlying mild COPD but I suspect that she has more of an asthmatic phenotype. Patient's underlying COPD seems  to be very well controlled. The etiology for her right upper quadrant/right lower chest pain is unclear. I cannot elicit any symptoms on physical exam today. With her history of nephrectomy and cholecystectomy there are multiple possible pathologies. She remains asymptomatic to any potential reflux. Her allergies seem to be a baseline as well. Reportedly she did have normal liver function testing earlier this week. I'm going to perform imaging to better assess this region of her body with her history of lung nodules within her right lower lung. I instructed the patient to contact my office if she had any new breathing problems or questions before next appointment.  1. COPD Mixed Type:  Recommended the patient try abstaining from Spiriva to see if this helps dry mouth and affects cough perceivably. Repeat spirometry with bronchodilator challenge at next appointment. 2. Right Upper Quadrant Abdominal Pain: Checking right upper quadrant ultrasound and CT chest without contrast. 3. Chronic Allergic Rhinitis: Continuing Flonase. No changes. 4. GERD:   Asymptomatic. No new medications. 5. Health Maintenance: S/P Influenza Vaccine September 2017, Prevnar  November 2014, & Pneumovax 16 June 2015. 6. Follow-up:  Return to clinic in 6 weeks or sooner if needed.  Sonia Baller Ashok Cordia, M.D. [facility] Pulmonary & Critical Care Pager:  [phone] After 3pm or if no response, call [phone] 3:25 PM 05/04/16

## 2016-05-16 ENCOUNTER — Ambulatory Visit (HOSPITAL_COMMUNITY)
Admission: RE | Admit: 2016-05-16 | Discharge: 2016-05-16 | Disposition: A | Discharge status: Home / Self Care | Payer: Medicare Other | Source: Ambulatory Visit | Attending: Pulmonary Disease | Admitting: Pulmonary Disease

## 2016-05-16 ENCOUNTER — Encounter (HOSPITAL_COMMUNITY): Payer: Self-pay

## 2016-05-16 DIAGNOSIS — R932 Abnormal findings on diagnostic imaging of liver and biliary tract: Secondary | ICD-10-CM | POA: Diagnosis not present

## 2016-05-16 DIAGNOSIS — R1011 Right upper quadrant pain: Secondary | ICD-10-CM | POA: Insufficient documentation

## 2016-05-16 DIAGNOSIS — R937 Abnormal findings on diagnostic imaging of other parts of musculoskeletal system: Secondary | ICD-10-CM | POA: Diagnosis not present

## 2016-05-16 DIAGNOSIS — Z9049 Acquired absence of other specified parts of digestive tract: Secondary | ICD-10-CM | POA: Insufficient documentation

## 2016-06-19 ENCOUNTER — Ambulatory Visit (INDEPENDENT_AMBULATORY_CARE_PROVIDER_SITE_OTHER): Payer: Medicare Other | Admitting: Pulmonary Disease

## 2016-06-19 ENCOUNTER — Encounter: Payer: Self-pay | Admitting: Pulmonary Disease

## 2016-06-19 VITALS — BP 138/90 | HR 91 | Ht 62.25 in | Wt 120.0 lb

## 2016-06-19 DIAGNOSIS — R059 Cough, unspecified: Secondary | ICD-10-CM

## 2016-06-19 DIAGNOSIS — J309 Allergic rhinitis, unspecified: Secondary | ICD-10-CM | POA: Diagnosis not present

## 2016-06-19 DIAGNOSIS — J449 Chronic obstructive pulmonary disease, unspecified: Secondary | ICD-10-CM

## 2016-06-19 DIAGNOSIS — R05 Cough: Secondary | ICD-10-CM | POA: Diagnosis not present

## 2016-06-19 LAB — PULMONARY FUNCTION TEST
FEF 25-75 Post: 1.11 L/sec
FEF 25-75 Pre: 0.97 L/sec
FEF2575-%CHANGE-POST: 14 %
FEF2575-%PRED-POST: 100 %
FEF2575-%Pred-Pre: 87 %
FEV1-%CHANGE-POST: 2 %
FEV1-%Pred-Post: 95 %
FEV1-%Pred-Pre: 93 %
FEV1-Post: 1.57 L
FEV1-Pre: 1.54 L
FEV1FVC-%CHANGE-POST: 4 %
FEV1FVC-%Pred-Pre: 99 %
FEV6-%Change-Post: -1 %
FEV6-%PRED-POST: 100 %
FEV6-%PRED-PRE: 101 %
FEV6-PRE: 2.11 L
FEV6-Post: 2.08 L
FEV6FVC-%Change-Post: 0 %
FEV6FVC-%Pred-Post: 106 %
FEV6FVC-%Pred-Pre: 105 %
FVC-%CHANGE-POST: -1 %
FVC-%PRED-POST: 94 %
FVC-%Pred-Pre: 95 %
FVC-Post: 2.09 L
FVC-Pre: 2.13 L
POST FEV1/FVC RATIO: 75 %
POST FEV6/FVC RATIO: 100 %
PRE FEV6/FVC RATIO: 99 %
Pre FEV1/FVC ratio: 72 %

## 2016-06-19 NOTE — Patient Instructions (Signed)
   We are referring you to the ENT specialists at [facility] to have them take a look at the back of your throat for a source of your cough.  Go ahead and stay off your Spiriva inhaler. Call me if you feel your cough or breathing is getting worse.  I will see you back in 8 weeks or so after you have seen the ENT specialist.

## 2016-06-19 NOTE — Progress Notes (Signed)
Subjective:    Patient ID: [name], female    DOB: July 16, 1930, 83 y.o.   MRN: [id number]  C.C.:  Follow-up for Mixed Type COPD, Chronic Allergic Rhinitis, & GERD.  HPI Mixed type COPD: Prescribed Spiriva previously and recommended a trial of abstinence at last appointment. She reports no exacerbations since last appointment. No change in her baseline dyspnea. No change in her baseline cough which is productive of a "thick, clear" mucus. Denies any wheezing.   Chronic allergic rhinitis: Previously using Claritin as needed and Flonase daily. She reports her usual mild sinus drainage. No new sinus congestion or pressure.   GERD: Asymptomatic off medication. Denies any reflux, dyspepsia, or morning brashwater taste. Does still have dry mouth.    Review of Systems No chest pain, pressure or tightness. No fever or chills. She reports no change in her right upper abdomen/lower thorax discomfort. She feels the pain is related to the back pain/arthritis.   Allergies  Allergen Reactions  . Tramadol Hcl Nausea And Vomiting  . Codeine     Hallucinations   . Doxycycline Other (See Comments)    Upsets stomach and makes feel uncomfortable    Current Outpatient Prescriptions on File Prior to Visit  Medication Sig Dispense Refill  . acetaminophen (TYLENOL) 325 MG tablet Take 325 mg by mouth every 6 (six) hours as needed.    . Cholecalciferol (VITAMIN D-3) 1000 UNITS CAPS Take 1 capsule by mouth daily.    . fluticasone (FLONASE) 50 MCG/ACT nasal spray Place 2 sprays into the nose daily as needed for allergies or rhinitis.     Marland Kitchen loratadine (CLARITIN) 10 MG tablet Take 10 mg by mouth daily as needed for allergies.    Marland Kitchen LORazepam (ATIVAN) 0.5 MG tablet Take 0.5 mg by mouth at bedtime.     . Multiple Vitamin (MULTIVITAMIN) tablet Take 1 tablet by mouth daily.    . naphazoline-glycerin (CLEAR EYES) 0.012-0.2 % SOLN Place 1-2 drops into both eyes every 4 (four) hours as needed for irritation.     Marland Kitchen olmesartan (BENICAR) 5 MG tablet Take 5-10 mg by mouth every morning. Pt takes 2 tablets a day    . Psyllium (METAMUCIL) 28.3 % POWD Take by mouth as needed.    . Coenzyme Q10 (CO Q10) 200 MG CAPS Take 1 tablet by mouth every other day.    Marland Kitchen SPIRIVA HANDIHALER 18 MCG inhalation capsule PLACE 1 CAPSULE (18 MCG TOTAL) INTO INHALER AND INHALE DAILY. (Patient not taking: Reported on 06/19/2016) 30 capsule 2  . vitamin B-12 (CYANOCOBALAMIN) 1000 MCG tablet Take 1,000 mcg by mouth every other day.     No current facility-administered medications on file prior to visit.     Past Medical History:  Diagnosis Date  . Arthritis   . BOOP (bronchiolitis obliterans with organizing pneumonia) ([place])   . Bronchitis    hx of   . Chronic kidney disease   . Claustrophobia   . Dysrhythmia   . Hypertension   . PONV (postoperative nausea and vomiting)   . Pulmonary nodules     Past Surgical History:  Procedure Laterality Date  . APPENDECTOMY    . cataract surgery      bilateral   . CHOLECYSTECTOMY    . CYSTOSCOPY WITH RETROGRADE PYELOGRAM, URETEROSCOPY AND STENT PLACEMENT Right 08/20/2014   Procedure: CYSTOSCOPY WITH RIGHT RETROGRADE PYELOGRAM, RIGHT DIAGNOSTIC URETEROSCOPY, RIGHT URETERAL STENT PLACEMENT;  Surgeon: Alexis Frock, MD;  Location: [facility];  Service: Urology;  Laterality:  Right;  Marland Kitchen HIP SURGERY     partial replacement of left hip   . meniscus tear surgery in right knee     . ROBOT ASSISTED LAPAROSCOPIC NEPHRECTOMY Right 10/01/2014   Procedure: ROBOTIC ASSISTED LAPAROSCOPIC NEPHRECTOMY LYSIS OF ADHESIONS;  Surgeon: Alexis Frock, MD;  Location: [facility];  Service: Urology;  Laterality: Right;  . TONSILLECTOMY      Family History  Problem Relation Age of Onset  . Hypertension Mother   . Cancer Mother   . Heart failure Father   . Emphysema Father   . Prostate cancer Brother     Social History   Social History  . Marital status: Married    Spouse name: N/A  . Number of  children: N/A  . Years of education: N/A   Social History Main Topics  . Smoking status: Passive Smoke Exposure - Never Smoker  . Smokeless tobacco: Never Used     Comment: From father  . Alcohol use 0.0 oz/week     Comment: occasional glass of wine   . Drug use: No  . Sexual activity: Not Asked   Other Topics Concern  . None   Social History Narrative   Patient is originally from [place]. She has always lived in [place]. She has prior travel to [place], [place], [place], & [place]. 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 Remote canary exposure as a child. No mold exposure. No asbestos exposure.       Objective:   Physical Exam BP 138/90 (BP Location: Left Arm, Patient Position: Sitting, Cuff Size: Normal)   Pulse 91   Ht 5' 2.25" (1.581 m)   Wt 120 lb (54.4 kg)   SpO2 97%   BMI 21.77 kg/m   Gen.: No distress. Comfortable. Then, Caucasian female. Integument: No rash or bruising on exposed skin. Warm and dry. Pulmonary: Clear with auscultation bilaterally. No accessory muscle use on room air. Being in complete sentences. HEENT: Minimal bilateral nasal turbinate swelling. No oral ulcers. Moist mucous membranes. Cardiovascular: Regular rate. Normal S1 & S2. No edema.  PFT 06/19/16: FVC 2.13 L (95%) FEV1 1.54 L (93%) FEV1/FVC 0.72 FEF 25-75 0.97 L (87%) no bronchodilator response 09/12/15: FVC 2.13 L (94%) FEV1 1.54 L (92%) FEV1/FVC 0.72 FEF 25-75 1.01 L (80%) no bronchodilator response TLC 4.18 L (100%) RV 116% ERV 169% DLCO corrected 79% (hemoglobin 12.3) 07/21/12: FVC 2.25 L (89%) FEV1 1.45 L (78%) FEV1/FVC 0.64 FEF 25-75 0.77 L (53%)  IMAGING CT CHEST W/O 05/16/16 (personally reviewed by me):  No parenchymal nodule, mass, or opacity appreciated. No pleural effusion or thickening. No pericardial effusion. No pathologic mediastinal adenopathy. RUQ ABD U/S 05/16/16 (per radiologist): 2 solid-appearing nodular densities within right liver lobe similar to prior CT imaging and  likely benign. Possible adjacent 2.3 x 1.2 x 2.4 heterogeneous solid-appearing right liver lobe adjacent nodule likely representing adjacent lymph node. Cholecystectomy without biliary distention. CXR PA/LAT 11/24/14 (previously reviewed by me): No nodule or opacity appreciated. No pleural effusion. Hyperinflation with flattening of the diaphragms and barreling of the chest. Heart normal in size. Mediastinum normal in contour.  LABS 06/06/15 Alpha-1 antitrypsin: MM (132)  10/03/14 CBC: 8.5/7.4/34.7/217 BMP: 135/3.9/103/29/13/1.24/93/8.4    Assessment & Plan:  83 y.o. female with underlying mixed type COPD, chronic allergic rhinitis, & GERD. Patient's ongoing cough is perplexing. Reviewing her chest CT imaging shows no evidence for parenchymal abnormality that would explain her cough. Her spirometry today is essentially unchanged compared with previous testing without a  significant bronchodilator response despite no bronchodilator therapy/inhaler therapy. Given her lack of symptomatic worsening off inhaler therapy and did not feel it's necessary to continue at this time. Her allergic rhinitis overall seems to be relatively stable. With her history of reflux I feel that visual inspection of her vocal cords and retropharynx is a necessary next and the patient is willing to have evaluation by ENT. I instructed the patient to contact my office if she had any new breathing problems or questions before next appointment.  1. Cough:  Unclear etiology. Referring to ENT at [place] for evaluation. 2. Mixed type COPD:  Continuing to hold on further inhaler medications at this time. Patient to notify me for any new symptoms. 3. Chronic allergic rhinitis:  Continuing Flonase. No changes. 4. Health maintenance: Status post Influenza Vaccine September 2017, Prevnar November 2014, & Pneumovax 16 June 2015. 5. Follow-up: Return to clinic in 8 weeks or sooner if needed.  Sonia Baller Ashok Cordia, M.D. [facility]  Pulmonary & Critical Care Pager:  [phone] After 3pm or if no response, call [phone] 4:06 PM 06/19/16

## 2016-06-19 NOTE — Progress Notes (Signed)
Spirometry done today. 

## 2016-06-19 NOTE — Addendum Note (Signed)
Addended by: Tyson Dense on: 06/19/2016 04:28 PM   Modules accepted: Orders

## 2016-08-24 ENCOUNTER — Ambulatory Visit (INDEPENDENT_AMBULATORY_CARE_PROVIDER_SITE_OTHER): Payer: Medicare Other | Admitting: Pulmonary Disease

## 2016-08-24 ENCOUNTER — Encounter: Payer: Self-pay | Admitting: Pulmonary Disease

## 2016-08-24 VITALS — BP 142/90 | HR 70 | Ht 62.25 in | Wt 116.6 lb

## 2016-08-24 DIAGNOSIS — J302 Other seasonal allergic rhinitis: Secondary | ICD-10-CM

## 2016-08-24 DIAGNOSIS — J449 Chronic obstructive pulmonary disease, unspecified: Secondary | ICD-10-CM | POA: Diagnosis not present

## 2016-08-24 NOTE — Patient Instructions (Signed)
   I will see you back in 1 year or sooner if needed.  Call me if your cough returns or you have any new breathing problems before then.

## 2016-08-24 NOTE — Progress Notes (Signed)
Subjective:    Patient ID: SHARONLEE NINE, female    DOB: [birth year], 83 y.o.   MRN: [id number]  C.C.:  Follow-up for Chronic Cough, Mixed Type COPD, & Chronic Allergic Rhinitis.  HPI Chronic cough: No clear etiology at last appointment. Refer to ENT at [facility] at last appointment. Call previously reported as productive of a "thick, clear" mucus. She reports she was seen by ENT without any abnormality. She reports her cough resolved off of Spiriva.   Mixed type COPD: No clear symptomatic benefit from inhaled medication therapy. She denies any wheezing or dyspnea.   Chronic allergic rhinitis: Patient instructed to use Flonase at last appointment. She is taking Claritin daily. She is using her Flonase only as needed.    Review of Systems No fever, chills, or sweats. No abdominal pain or nausea. No chest pain or pressure.   Allergies  Allergen Reactions  . Tramadol Hcl Nausea And Vomiting  . Codeine     Hallucinations   . Doxycycline Other (See Comments)    Upsets stomach and makes feel uncomfortable    Current Outpatient Prescriptions on File Prior to Visit  Medication Sig Dispense Refill  . acetaminophen (TYLENOL) 325 MG tablet Take 325 mg by mouth every 6 (six) hours as needed.    . Cholecalciferol (VITAMIN D-3) 1000 UNITS CAPS Take 1 capsule by mouth daily.    . fluticasone (FLONASE) 50 MCG/ACT nasal spray Place 2 sprays into the nose daily as needed for allergies or rhinitis.     Marland Kitchen loratadine (CLARITIN) 10 MG tablet Take 10 mg by mouth daily as needed for allergies.    Marland Kitchen LORazepam (ATIVAN) 0.5 MG tablet Take 0.5 mg by mouth at bedtime.     . Multiple Vitamin (MULTIVITAMIN) tablet Take 1 tablet by mouth daily.    . naphazoline-glycerin (CLEAR EYES) 0.012-0.2 % SOLN Place 1-2 drops into both eyes every 4 (four) hours as needed for irritation.    Marland Kitchen olmesartan (BENICAR) 5 MG tablet Take 5-10 mg by mouth every morning. Pt takes 2 tablets a day    . Psyllium (METAMUCIL)  28.3 % POWD Take by mouth as needed.    . vitamin B-12 (CYANOCOBALAMIN) 1000 MCG tablet Take 1,000 mcg by mouth once a week.     . Coenzyme Q10 (CO Q10) 200 MG CAPS Take 1 tablet by mouth every other day.    Marland Kitchen SPIRIVA HANDIHALER 18 MCG inhalation capsule PLACE 1 CAPSULE (18 MCG TOTAL) INTO INHALER AND INHALE DAILY. (Patient not taking: Reported on 08/24/2016) 30 capsule 2   No current facility-administered medications on file prior to visit.     Past Medical History:  Diagnosis Date  . Arthritis   . BOOP (bronchiolitis obliterans with organizing pneumonia) ([place])   . Bronchitis    hx of   . Chronic kidney disease   . Claustrophobia   . Dysrhythmia   . Hypertension   . PONV (postoperative nausea and vomiting)   . Pulmonary nodules     Past Surgical History:  Procedure Laterality Date  . APPENDECTOMY    . cataract surgery      bilateral   . CHOLECYSTECTOMY    . CYSTOSCOPY WITH RETROGRADE PYELOGRAM, URETEROSCOPY AND STENT PLACEMENT Right 08/20/2014   Procedure: CYSTOSCOPY WITH RIGHT RETROGRADE PYELOGRAM, RIGHT DIAGNOSTIC URETEROSCOPY, RIGHT URETERAL STENT PLACEMENT;  Surgeon: Alexis Frock, MD;  Location: [facility];  Service: Urology;  Laterality: Right;  . HIP SURGERY     partial replacement of left hip   .  meniscus tear surgery in right knee     . ROBOT ASSISTED LAPAROSCOPIC NEPHRECTOMY Right 10/01/2014   Procedure: ROBOTIC ASSISTED LAPAROSCOPIC NEPHRECTOMY LYSIS OF ADHESIONS;  Surgeon: Alexis Frock, MD;  Location: [facility];  Service: Urology;  Laterality: Right;  . TONSILLECTOMY      Family History  Problem Relation Age of Onset  . Hypertension Mother   . Cancer Mother   . Heart failure Father   . Emphysema Father   . Prostate cancer Brother     Social History   Social History  . Marital status: Married    Spouse name: N/A  . Number of children: N/A  . Years of education: N/A   Social History Main Topics  . Smoking status: Passive Smoke Exposure - Never Smoker  .  Smokeless tobacco: Never Used     Comment: From father  . Alcohol use 0.0 oz/week     Comment: occasional glass of wine   . Drug use: No  . Sexual activity: Not Asked   Other Topics Concern  . None   Social History Narrative   Patient is originally from [place]. She has always lived in [place]. She has prior travel to [place], [place], [place], & [place]. 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 Remote canary exposure as a child. No mold exposure. No asbestos exposure.       Objective:   Physical Exam BP (!) 142/90 (BP Location: Left Arm, Patient Position: Sitting, Cuff Size: Normal)   Pulse 70   Ht 5' 2.25" (1.581 m)   Wt 116 lb 9.6 oz (52.9 kg)   SpO2 98%   BMI 21.16 kg/m   General:  Awake. Alert. No acute distress. Thin, frail Caucasian female. Integument:  Warm & dry. No rash on exposed skin. No bruising on exposed skin. Extremities:  No cyanosis or clubbing.  HEENT:  Moist mucus membranes. Minimal nasal turbinate swelling. No oral ulcers. Cardiovascular:  Regular rate. No edema. Normal S1 & S2.  Pulmonary:  Good aeration bilaterally. Clear with auscultation. Normal work of breathing on room air. Abdomen: Soft. Normal bowel sounds. Nondistended.  Musculoskeletal:  Normal bulk and tone. No joint deformity or effusion appreciated.  PFT 06/19/16: FVC 2.13 L (95%) FEV1 1.54 L (93%) FEV1/FVC 0.72 FEF 25-75 0.97 L (87%) no bronchodilator response 09/12/15: FVC 2.13 L (94%) FEV1 1.54 L (92%) FEV1/FVC 0.72 FEF 25-75 1.01 L (80%) no bronchodilator response TLC 4.18 L (100%) RV 116% ERV 169% DLCO corrected 79% (hemoglobin 12.3) 07/21/12: FVC 2.25 L (89%) FEV1 1.45 L (78%) FEV1/FVC 0.64 FEF 25-75 0.77 L (53%)  IMAGING CT CHEST W/O 05/16/16 (previously reviewed by me):  No parenchymal nodule, mass, or opacity appreciated. No pleural effusion or thickening. No pericardial effusion. No pathologic mediastinal adenopathy. RUQ ABD U/S 05/16/16 (per radiologist): 2 solid-appearing nodular  densities within right liver lobe similar to prior CT imaging and likely benign. Possible adjacent 2.3 x 1.2 x 2.4 heterogeneous solid-appearing right liver lobe adjacent nodule likely representing adjacent lymph node. Cholecystectomy without biliary distention. CXR PA/LAT 11/24/14 (previously reviewed by me): No nodule or opacity appreciated. No pleural effusion. Hyperinflation with flattening of the diaphragms and barreling of the chest. Heart normal in size. Mediastinum normal in contour.  LABS 06/06/15 Alpha-1 antitrypsin: MM (132)  10/03/14 CBC: 8.5/7.4/34.7/217 BMP: 135/3.9/103/29/13/1.24/93/8.4    Assessment & Plan:  83 y.o. female with underlying mixed type COPD, chronic allergic rhinitis, & chronic cough. Her cough seems to resolve after discontinuing Spiriva. Overall her allergic  rhinitis seems to be reasonably well controlled and she has minimal symptoms from her mixed type COPD which I feel is likely more asthma than true COPD. I instructed the patient to contact my office if she had any new breathing problems otherwise I will see her back in one year.  1. Cough:  Resolved. Likely secondary to Spiriva.  2. Mixed type COPD:  Continuing Albuterol inhaler as needed. No new inhalers for now. 3. Chronic allergic rhinitis:  Continuing Claritin and Flonase as needed. 4. Health maintenance:  Status post Influenza Vaccine September 2017, Prevnar November 2014, & Pneumovax 16 June 2015. 5. Follow-up: Return to clinic in 1 year or sooner if needed.   Sonia Baller Ashok Cordia, M.D. [facility] Pulmonary & Critical Care Pager:  [phone] After 3pm or if no response, call [phone] 12:12 PM 08/24/16

## 2016-11-22 ENCOUNTER — Encounter: Payer: Self-pay | Admitting: Podiatry

## 2016-11-22 ENCOUNTER — Ambulatory Visit: Payer: Medicare Other

## 2016-11-22 ENCOUNTER — Ambulatory Visit (INDEPENDENT_AMBULATORY_CARE_PROVIDER_SITE_OTHER): Payer: Medicare Other | Admitting: Podiatry

## 2016-11-22 DIAGNOSIS — L84 Corns and callosities: Secondary | ICD-10-CM | POA: Diagnosis not present

## 2016-11-22 DIAGNOSIS — M79671 Pain in right foot: Secondary | ICD-10-CM

## 2016-11-22 NOTE — Progress Notes (Signed)
Subjective:    Patient ID: [name], female   DOB: 83 y.o.   MRN: [id number]   HPI patient presents stating I have a really bad lesion on the second digit right that's painful and I know I have chronic pain with hammertoe deformity    ROS      Objective:  Physical Exam neurovascular status intact with patient found to have irritation of the right second digit with large keratotic lesion on the inside of the toe that's painful     Assessment:   Hammertoe deformity second right with foot pain and keratotic lesion that's quite thickened      Plan:   Discussed the foot pain and digital deformity and at this point we'll continue conservative treatment and I went ahead today and I exposed the area debrided tissue applied sterile dressing and instructed on padding therapy

## 2016-12-10 ENCOUNTER — Encounter: Payer: Self-pay | Admitting: Physician Assistant

## 2016-12-10 ENCOUNTER — Ambulatory Visit (INDEPENDENT_AMBULATORY_CARE_PROVIDER_SITE_OTHER): Payer: Medicare Other | Admitting: Physician Assistant

## 2016-12-10 VITALS — BP 140/86 | HR 86 | Ht 62.0 in | Wt 113.0 lb

## 2016-12-10 DIAGNOSIS — Q600 Renal agenesis, unilateral: Secondary | ICD-10-CM | POA: Diagnosis not present

## 2016-12-10 DIAGNOSIS — R0602 Shortness of breath: Secondary | ICD-10-CM | POA: Diagnosis not present

## 2016-12-10 DIAGNOSIS — I10 Essential (primary) hypertension: Secondary | ICD-10-CM | POA: Diagnosis not present

## 2016-12-10 DIAGNOSIS — R0789 Other chest pain: Secondary | ICD-10-CM | POA: Diagnosis not present

## 2016-12-10 DIAGNOSIS — R5383 Other fatigue: Secondary | ICD-10-CM

## 2016-12-10 DIAGNOSIS — IMO0002 Reserved for concepts with insufficient information to code with codable children: Secondary | ICD-10-CM

## 2016-12-10 NOTE — Patient Instructions (Addendum)
Medication Instructions:   No changes to current medication regimen.  Labwork:   No labs ordered.  Testing/Procedures:  Your physician has requested that you have an echocardiogram. Echocardiography is a painless test that uses sound waves to create images of your heart. It provides your doctor with information about the size and shape of your heart and how well your heart's chambers and valves are working. This procedure takes approximately one hour. There are no restrictions for this procedure.  Your physician has requested that you have an exercise tolerance test. For further information please visit [email or web address]. Please also follow instruction sheet, as given.    Follow-Up:  With Dr. Sallyanne Kuster in 5-6 months (earlier if stress test comes back abnormal)  If you need a refill on your cardiac medications before your next appointment, please call your pharmacy.

## 2016-12-10 NOTE — Progress Notes (Signed)
Cardiology Office Note    Date:  12/11/2016   ID:  [name], DOB 1930-12-13, MRN [id number]  PCP:  Maurice Small, MD  Cardiologist:  Case discussed with Dr. Sallyanne Kuster   Chief Complaint  Patient presents with  . New Patient (Initial Visit)    case discussed with Dr. Sallyanne Kuster DOD    History of Present Illness:  [name] is a 83 y.o. female with PMH of BOOP with pulmonary fibrosis, solitary kidney status post right nephrectomy, CKD and HTN. She had a negative stress echocardiogram on 07/03/2010. Abdominal ultrasound in February 2018 showed 2 solid appearing nodular density in the right lobe liver, the same finding was noted on multiple prior CT, therefore likely benign.  She has been having occasional palpitation for years. It is not increasing in frequency it does not worsen with exertion. She says it typically occurs about once a week lasting only a few seconds at the time. Initially she had a feeling of skipped heartbeat, nowaday, she has a tachycardia palpitation feeling. She denies any dizziness or presyncope. She denies any shortness of breath associated with the palpitation. Given the infrequent episode and transient duration, I did not recommend a heart monitor at this time. She also has been feeling quite fatigued in the recent few weeks. She says this is new for her as she previously has been fairly active. She occasional has a pain under the left breast and also the back. She has no back pain. The pain under her left breast is better with exertion, this appears to be very atypical. I plan to obtain a GXT. As far as her fatigue, I have reviewed outside labs including recent hemoglobin and TSH which were normal. I recommended echocardiogram to rule out cardiac cause.   Past Medical History:  Diagnosis Date  . Arthritis   . BOOP (bronchiolitis obliterans with organizing pneumonia) ([place])   . Bronchitis    hx of   . Chronic kidney disease   . Claustrophobia   .  Dysrhythmia   . Hypertension   . PONV (postoperative nausea and vomiting)   . Pulmonary nodules     Past Surgical History:  Procedure Laterality Date  . APPENDECTOMY    . cataract surgery      bilateral   . CHOLECYSTECTOMY    . CYSTOSCOPY WITH RETROGRADE PYELOGRAM, URETEROSCOPY AND STENT PLACEMENT Right 08/20/2014   Procedure: CYSTOSCOPY WITH RIGHT RETROGRADE PYELOGRAM, RIGHT DIAGNOSTIC URETEROSCOPY, RIGHT URETERAL STENT PLACEMENT;  Surgeon: Alexis Frock, MD;  Location: [facility];  Service: Urology;  Laterality: Right;  . HIP SURGERY     partial replacement of left hip   . meniscus tear surgery in right knee     . ROBOT ASSISTED LAPAROSCOPIC NEPHRECTOMY Right 10/01/2014   Procedure: ROBOTIC ASSISTED LAPAROSCOPIC NEPHRECTOMY LYSIS OF ADHESIONS;  Surgeon: Alexis Frock, MD;  Location: [facility];  Service: Urology;  Laterality: Right;  . TONSILLECTOMY      Current Medications: Outpatient Medications Prior to Visit  Medication Sig Dispense Refill  . acetaminophen (TYLENOL) 325 MG tablet Take 325 mg by mouth every 6 (six) hours as needed.    . Cholecalciferol (VITAMIN D-3) 1000 UNITS CAPS Take 1 capsule by mouth daily.    . fluticasone (FLONASE) 50 MCG/ACT nasal spray Place 2 sprays into the nose daily as needed for allergies or rhinitis.     Marland Kitchen loratadine (CLARITIN) 10 MG tablet Take 10 mg by mouth daily as needed for allergies.    Marland Kitchen LORazepam (  ATIVAN) 0.5 MG tablet Take 0.5 mg by mouth at bedtime.     . Multiple Vitamin (MULTIVITAMIN) tablet Take 1 tablet by mouth daily as needed.     . naphazoline-glycerin (CLEAR EYES) 0.012-0.2 % SOLN Place 1-2 drops into both eyes every 4 (four) hours as needed for irritation.    Marland Kitchen olmesartan (BENICAR) 5 MG tablet Take 5-10 mg by mouth every morning. Pt takes 2 tablets a day    . Psyllium (METAMUCIL) 28.3 % POWD Take by mouth as needed.    . venlafaxine XR (EFFEXOR-XR) 37.5 MG 24 hr capsule TAKE 1 CAPSULE BY MOUTH EVERY DAY WITH FOOD  11  . vitamin B-12  (CYANOCOBALAMIN) 1000 MCG tablet Take 1,000 mcg by mouth daily as needed.     . Coenzyme Q10 (CO Q10) 200 MG CAPS Take 1 tablet by mouth every other day.    Marland Kitchen SPIRIVA HANDIHALER 18 MCG inhalation capsule PLACE 1 CAPSULE (18 MCG TOTAL) INTO INHALER AND INHALE DAILY. 30 capsule 2   No facility-administered medications prior to visit.      Allergies:   Tramadol hcl; Tramadol hcl; Doxycycline; and Codeine   Social History   Social History  . Marital status: Married    Spouse name: N/A  . Number of children: N/A  . Years of education: N/A   Social History Main Topics  . Smoking status: Passive Smoke Exposure - Never Smoker  . Smokeless tobacco: Never Used     Comment: From father  . Alcohol use 0.0 oz/week     Comment: occasional glass of wine   . Drug use: No  . Sexual activity: Not Asked   Other Topics Concern  . None   Social History Narrative   Patient is originally from [place]. She has always lived in [place]. She has prior travel to [place], [place], [place], & [place]. 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 Remote canary exposure as a child. No mold exposure. No asbestos exposure.      Family History:  The patient's family history includes CAD in her brother; Cancer in her mother; Emphysema in her father; Heart failure in her father; Hypertension in her mother; Prostate cancer in her brother.   ROS:   Please see the history of present illness.    ROS All other systems reviewed and are negative.   PHYSICAL EXAM:   VS:  BP 140/86 (BP Location: Left Arm, Cuff Size: Normal)   Pulse 86   Ht 5\' 2"  (1.575 m)   Wt 113 lb (51.3 kg)   BMI 20.67 kg/m    GEN: Well nourished, well developed, in no acute distress  HEENT: normal  Neck: no JVD, carotid bruits, or masses Cardiac: RRR; no murmurs, rubs, or gallops,no edema  Respiratory:  clear to auscultation bilaterally, normal work of breathing GI: soft, nontender, nondistended, + BS MS: no deformity or atrophy  Skin:  warm and dry, no rash Neuro:  Alert and Oriented x 3, Strength and sensation are intact Psych: euthymic mood, full affect  Wt Readings from Last 3 Encounters:  12/10/16 113 lb (51.3 kg)  08/24/16 116 lb 9.6 oz (52.9 kg)  06/19/16 120 lb (54.4 kg)      Studies/Labs Reviewed:   EKG:  EKG is ordered today.  The ekg ordered today demonstrates Normal sinus rhythm without significant ST-T wave changes.  Recent Labs: No results found for requested labs within last 8760 hours.   Lipid Panel No results found for: CHOL, TRIG, HDL,  CHOLHDL, VLDL, LDLCALC, LDLDIRECT  Additional studies/ records that were reviewed today include:   Notes from Primary care provider  Normal stress echo 07/03/2010  ASSESSMENT:    1. Fatigue, unspecified type   2. Atypical chest pain   3. Shortness of breath   4. Solitary kidney   5. Essential hypertension      PLAN:  In order of problems listed above:  1. Atypical chest pain: Does not symptom occur with exertion. We'll plan for outpatient GXT  2. Fatigue and shortness of breath: We will obtain an echocardiogram. Outpatient TSH and hemoglobin were  Normal  3. Solitary kidney: Status post right nephrectomy. Renal function is stable  4. Hypertension: Blood pressure mildly elevated: Continue on current medication    Medication Adjustments/Labs and Tests Ordered: Current medicines are reviewed at length with the patient today.  Concerns regarding medicines are outlined above.  Medication changes, Labs and Tests ordered today are listed in the Patient Instructions below. Patient Instructions  Medication Instructions:   No changes to current medication regimen.  Labwork:   No labs ordered.  Testing/Procedures:  Your physician has requested that you have an echocardiogram. Echocardiography is a painless test that uses sound waves to create images of your heart. It provides your doctor with information about the size and shape of your heart and how  well your heart's chambers and valves are working. This procedure takes approximately one hour. There are no restrictions for this procedure.  Your physician has requested that you have an exercise tolerance test. For further information please visit [email or web address]. Please also follow instruction sheet, as given.    Follow-Up:  With Dr. Sallyanne Kuster in 5-6 months (earlier if stress test comes back abnormal)  If you need a refill on your cardiac medications before your next appointment, please call your pharmacy.      Hilbert Corrigan, [place]  12/11/2016 11:45 PM    [facility] [address] [place], [place],   [address] Phone: [phone]; Fax: [phone]

## 2016-12-11 ENCOUNTER — Encounter: Payer: Self-pay | Admitting: Physician Assistant

## 2016-12-18 ENCOUNTER — Other Ambulatory Visit (HOSPITAL_COMMUNITY): Payer: Medicare Other

## 2016-12-18 ENCOUNTER — Telehealth (HOSPITAL_COMMUNITY): Payer: Self-pay

## 2016-12-18 NOTE — Telephone Encounter (Signed)
Encounter complete. 

## 2016-12-19 ENCOUNTER — Ambulatory Visit (HOSPITAL_COMMUNITY)
Admission: RE | Admit: 2016-12-19 | Discharge: 2016-12-19 | Disposition: A | Discharge status: Home / Self Care | Payer: Medicare Other | Source: Ambulatory Visit | Attending: Cardiology | Admitting: Cardiology

## 2016-12-19 DIAGNOSIS — R5383 Other fatigue: Secondary | ICD-10-CM | POA: Diagnosis not present

## 2016-12-19 DIAGNOSIS — R0602 Shortness of breath: Secondary | ICD-10-CM | POA: Diagnosis not present

## 2016-12-19 DIAGNOSIS — R0789 Other chest pain: Secondary | ICD-10-CM

## 2016-12-19 LAB — EXERCISE TOLERANCE TEST
CHL RATE OF PERCEIVED EXERTION: 18
CSEPED: 5 min
CSEPEDS: 1 s
CSEPEW: 7 METS
MPHR: 136 {beats}/min
Peak HR: 141 {beats}/min
Percent HR: 103 %
Rest HR: 72 {beats}/min

## 2016-12-25 ENCOUNTER — Other Ambulatory Visit: Payer: Self-pay

## 2016-12-25 ENCOUNTER — Ambulatory Visit (HOSPITAL_COMMUNITY): Payer: Medicare Other | Attending: Cardiology

## 2016-12-25 DIAGNOSIS — I129 Hypertensive chronic kidney disease with stage 1 through stage 4 chronic kidney disease, or unspecified chronic kidney disease: Secondary | ICD-10-CM | POA: Diagnosis not present

## 2016-12-25 DIAGNOSIS — N189 Chronic kidney disease, unspecified: Secondary | ICD-10-CM | POA: Insufficient documentation

## 2016-12-25 DIAGNOSIS — R0789 Other chest pain: Secondary | ICD-10-CM | POA: Diagnosis not present

## 2016-12-25 DIAGNOSIS — I253 Aneurysm of heart: Secondary | ICD-10-CM | POA: Insufficient documentation

## 2016-12-25 DIAGNOSIS — R0602 Shortness of breath: Secondary | ICD-10-CM | POA: Insufficient documentation

## 2016-12-25 DIAGNOSIS — R5383 Other fatigue: Secondary | ICD-10-CM | POA: Diagnosis not present

## 2017-04-12 IMAGING — CT CT CHEST W/O CM
2 of 3 series · 15 of 36 positions shown, 18 images · non-contrast
Comparison: None.

CLINICAL DATA: Chronic cough and asthma

EXAM:
CT CHEST WITHOUT CONTRAST
TECHNIQUE: Multidetector CT imaging of the chest was performed following the
standard protocol without IV contrast.

[Series 2: thorax · axial · 0.66mm/px · z∈[+1364,+1616]mm · 12 of 148 slices shown, 15 images]
[im 11/148  mediastinal]
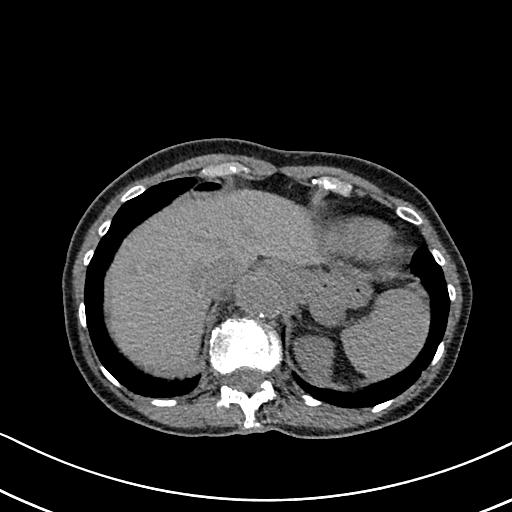
[im 11/148  lung]
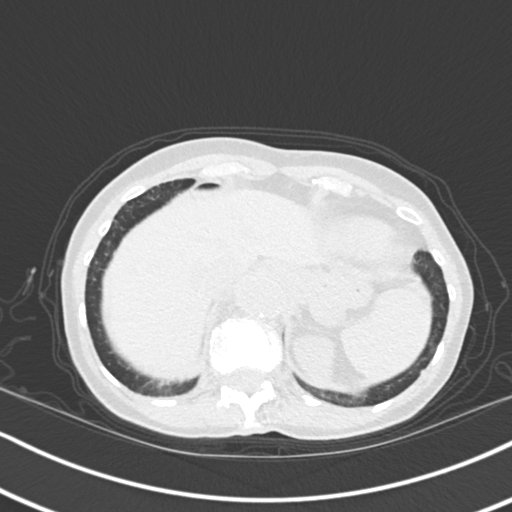
[im 22/148  lung]
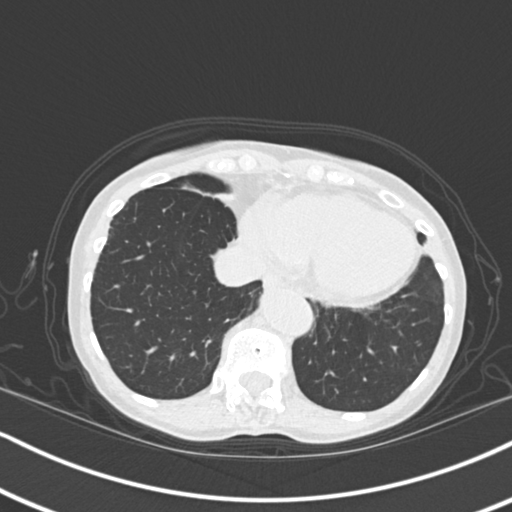
[im 33/148  lung]
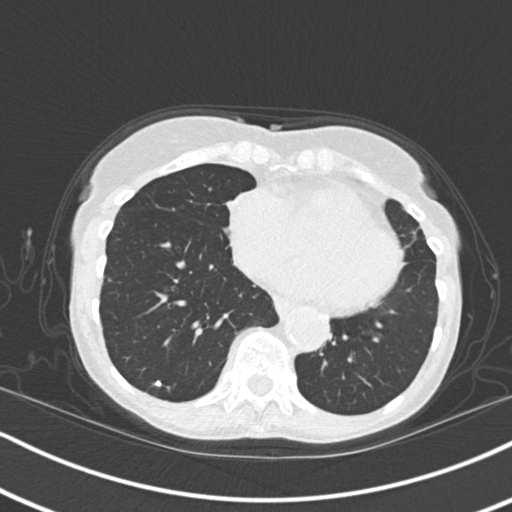
[im 44/148  lung]
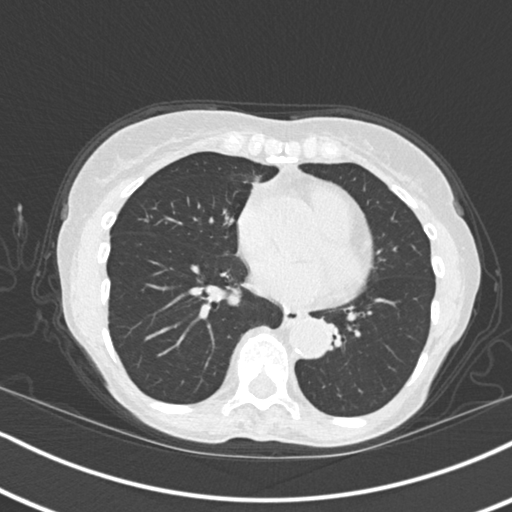
[im 55/148  mediastinal]
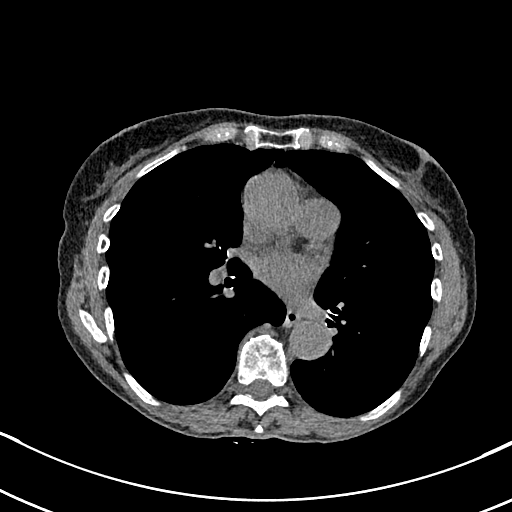
[im 55/148  lung]
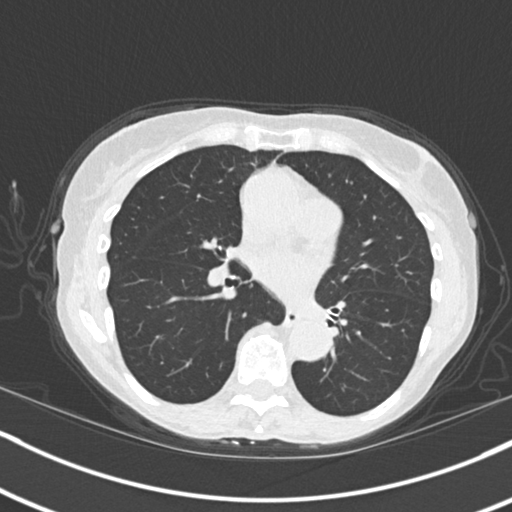
[im 66/148  lung]
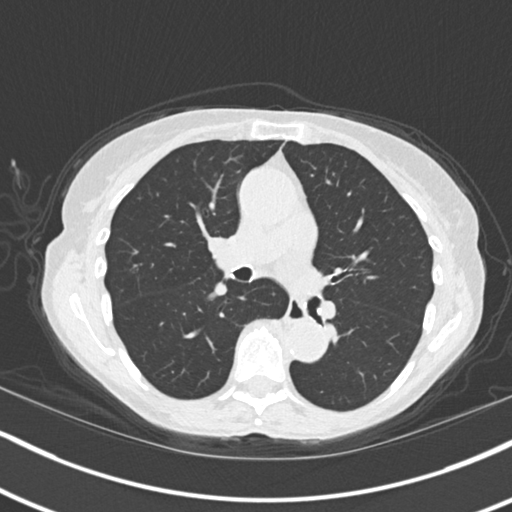
[im 82/148  lung]
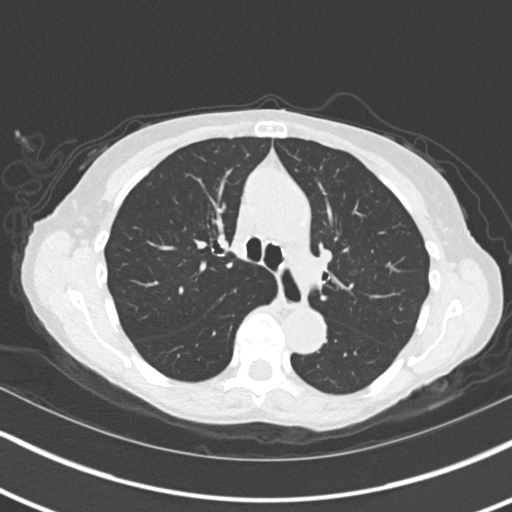
[im 93/148  lung]
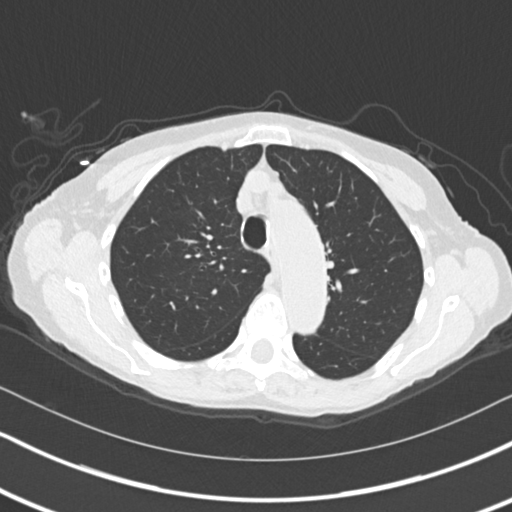
[im 104/148  mediastinal]
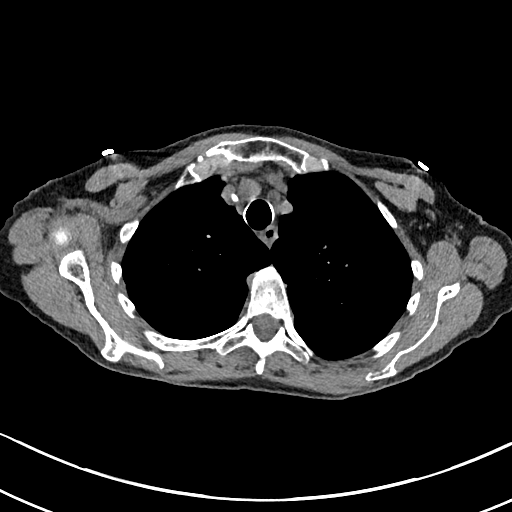
[im 104/148  lung]
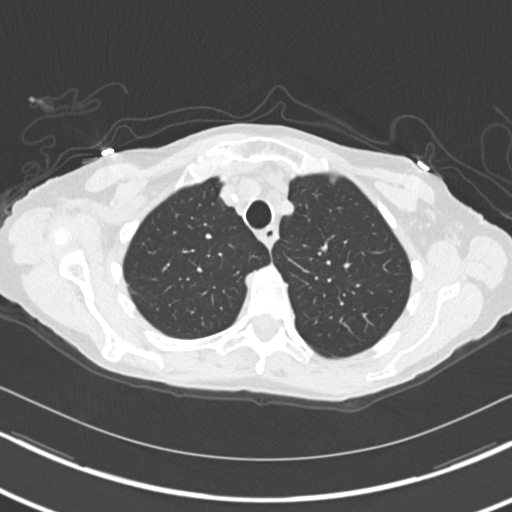
[im 115/148  lung]
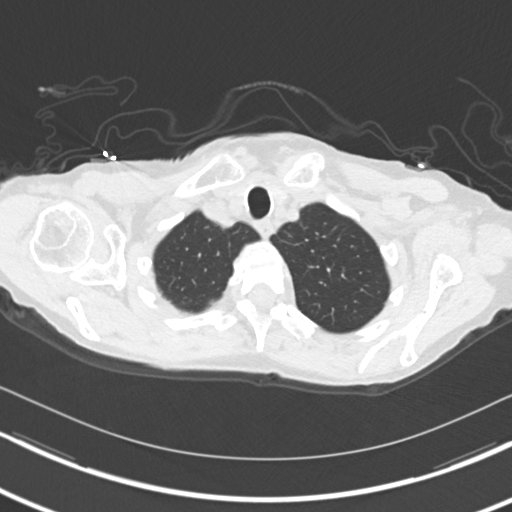
[im 126/148  lung]
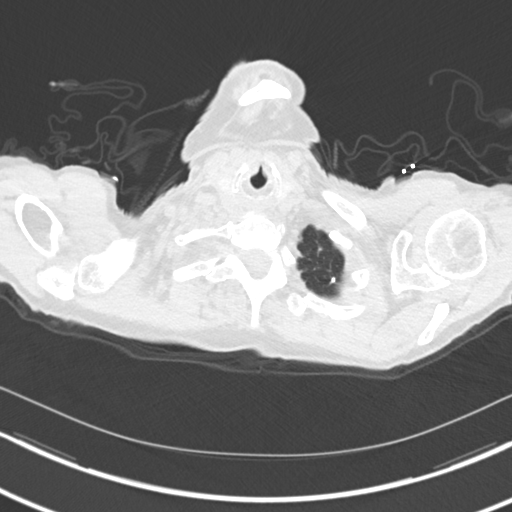
[im 137/148  lung]
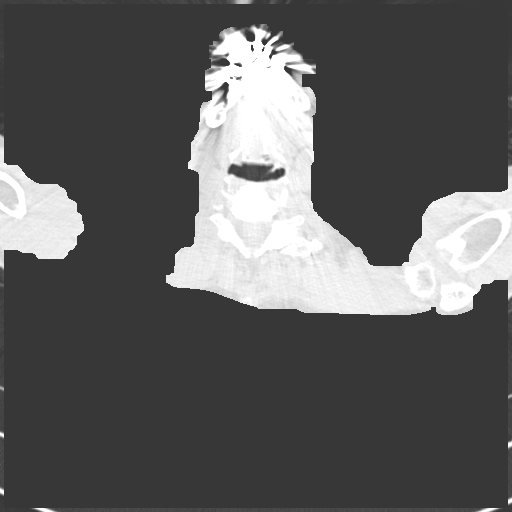

[Series 5: coronal · coronal · 0.59mm/px · 3 of 113 slices shown]
[im 23/113  lung]
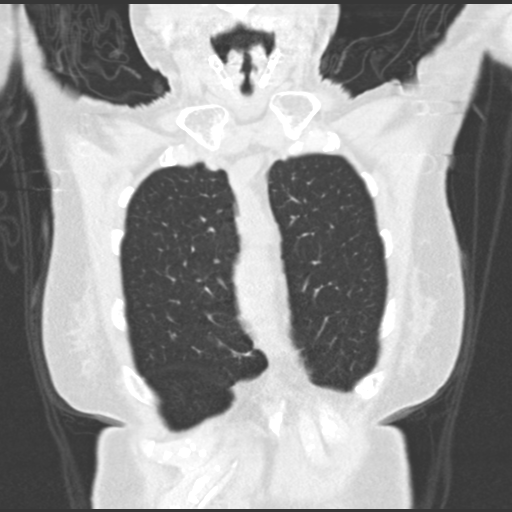
[im 45/113  lung]
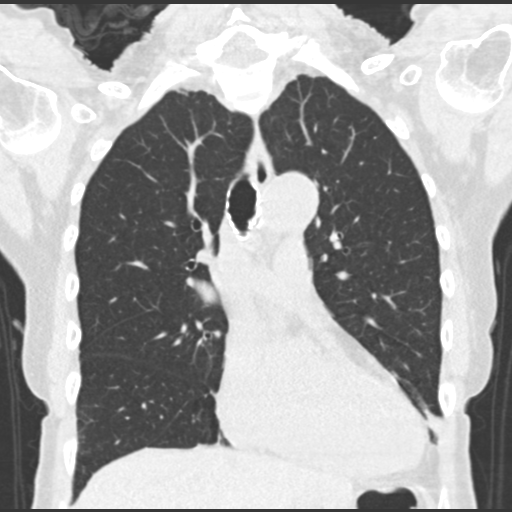
[im 68/113  lung]
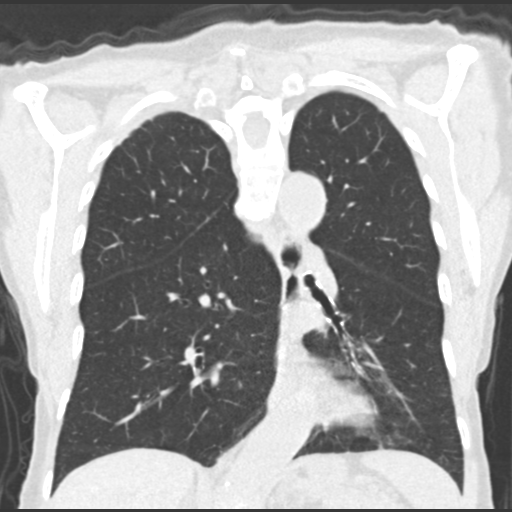

[15 of 36 positions shown; findings below may reference images not displayed]

FINDINGS: Cardiovascular: No significant vascular findings. Normal heart size.
No pericardial effusion.

Mediastinum/Nodes: No axillary supraclavicular lymph nodes. No
mediastinal hilar lymph nodes. No pericardial fluid.

Lungs/Pleura: Mild atelectasis in lingula and RIGHT middle lobe. No
suspicious nodularity. No airspace disease suggests acute infection.
Calcified granuloma in the RIGHT lower lobe. No pleural fluid.

Upper Abdomen: Limited view of the liver, kidneys, pancreas are
unremarkable. Normal adrenal glands.

Musculoskeletal: No aggressive osseous lesion.  Insert spine spurs
IMPRESSION: 1. No acute pulmonary parenchymal findings.
2. No evidence of infection or inflammation within lung parenchyma.
3. Degenerate spurring of the spine.

## 2017-04-17 ENCOUNTER — Telehealth: Payer: Self-pay | Admitting: Adult Health

## 2017-04-17 NOTE — Telephone Encounter (Signed)
Please explain to patient that when  Dr. Ashok Cordia saw  Her last  in June his note reflects he felt that the Spiriva Handihaler was the cause of her cough. We will need to schedule her to be seen by Dr. Melvyn Novas for treatment of the cough.Please call and make an appointment with Dr. Melvyn Novas for patient. Thanks so much

## 2017-04-17 NOTE — Telephone Encounter (Signed)
Called and spoke with patient regarding SG recommendations Pt verbalized understanding but had questions and concerns Pt reports productive cough-yellow mucus, horseness, and "frong in her throat feeling" with gagging and wheezing  Pt declined to be seen with MW, and would like to be seen tomorrow Scheduled pt with TP 04/18/17 at 9:45am Nothing further needed at this time

## 2017-04-17 NOTE — Telephone Encounter (Signed)
Called and spoke with pt.  Pt is requesting Sprivia Handihaler refills.  Per JN's last OV note Spiriva was d/c. Pt states her chronic cough has returned and spiriva is effective for cough.  Below I copied JN's instructions.   SG please advise on refills. Thanks.   Assessment & Plan:  84 y.o. female with underlying mixed type COPD, chronic allergic rhinitis, & chronic cough. Her cough seems to resolve after discontinuing Spiriva. Overall her allergic rhinitis seems to be reasonably well controlled and she has minimal symptoms from her mixed type COPD which I feel is likely more asthma than true COPD. I instructed the patient to contact my office if she had any new breathing problems otherwise I will see her back in one year.  1. Cough:  Resolved. Likely secondary to Spiriva.  2. Mixed type COPD:  Continuing Albuterol inhaler as needed. No new inhalers for now. 3. Chronic allergic rhinitis:  Continuing Claritin and Flonase as needed. 4. Health maintenance:  Status post Influenza Vaccine September 2017, Prevnar November 2014, & Pneumovax 16 June 2015. 5. Follow-up: Return to clinic in 1 year or sooner if needed.   Sonia Baller Ashok Cordia, M.D. [facility] Pulmonary & Critical Care Pager:  [phone] After 3pm or if no response, call [phone] 12:12 PM 08/24/16

## 2017-04-18 ENCOUNTER — Ambulatory Visit: Payer: Medicare Other | Admitting: Adult Health

## 2017-04-18 ENCOUNTER — Encounter: Payer: Self-pay | Admitting: Adult Health

## 2017-04-18 DIAGNOSIS — J449 Chronic obstructive pulmonary disease, unspecified: Secondary | ICD-10-CM | POA: Diagnosis not present

## 2017-04-18 DIAGNOSIS — J309 Allergic rhinitis, unspecified: Secondary | ICD-10-CM

## 2017-04-18 MED ORDER — TIOTROPIUM BROMIDE MONOHYDRATE 18 MCG IN CAPS: 18.0000 ug | ORAL_CAPSULE | Freq: Every day | RESPIRATORY_TRACT | 5 refills | Status: DC

## 2017-04-18 NOTE — Assessment & Plan Note (Signed)
Control for tirggers claritin and flonase

## 2017-04-18 NOTE — Addendum Note (Signed)
Addended by: Parke Poisson E on: 04/18/2017 10:39 AM   Modules accepted: Orders

## 2017-04-18 NOTE — Patient Instructions (Signed)
Restart Spiriva 1 puff daily .  USe Mucinex DM Twice daily  As needed -Liquid  Follow up with Dr. Chase Caller in 6 months and As needed

## 2017-04-18 NOTE — Progress Notes (Signed)
@Patient  ID: [name], female    DOB: 05-14-1930, 84 y.o.   MRN: [id number]  Chief Complaint  Patient presents with  . Follow-up    COPD     Referring provider: Maurice Small, MD  HPI: 84 yo female never smoker followed for mixed COPD/Asthma , chronic cough and allergic rhinitis  TEST  06/06/15 Alpha-1 antitrypsin: MM (132)  CT CHEST W/O 05/16/16 :  No parenchymal nodule, mass, or opacity appreciated. No pleural effusion or thickening. No pericardial effusion. No pathologic mediastinal adenopathy.  06/19/16: FVC 2.13 L (95%) FEV1 1.54 L (93%) FEV1/FVC 0.72 FEF 25-75 0.97 L (87%) no bronchodilator response 09/12/15: FVC 2.13 L (94%) FEV1 1.54 L (92%) FEV1/FVC 0.72 FEF 25-75 1.01 L (80%) no bronchodilator response TLC 4.18 L (100%) RV 116% ERV 169% DLCO corrected 79% (hemoglobin 12.3) 07/21/12: FVC 2.25 L (89%) FEV1 1.45 L (78%) FEV1/FVC 0.64 FEF 25-75 0.77 L (53%)  Previous evaluation at [facility] voice center.     04/18/2017 Follow up : COPD and AR  Pt returns for a follow up . She has Mild COPD and was previously on Spriiva but has been off for a few months. Says over last month breathing has not been doing as well and cough has picked up . She says she has a chronic cough which is worse for last few week. All the rainy weather seems to make it worse.  Says that spiriva has been the best help in the past , seems to help her cough and breathing .  She denies chest pain, orthopnea, edema or fever.  No discolored mucus.    Allergies  Allergen Reactions  . Tramadol Hcl Nausea And Vomiting  . Tramadol Hcl Nausea And Vomiting  . Doxycycline Other (See Comments)    Other reaction(s): GI Upset (intolerance) Upsets stomach and makes feel uncomfortable Upsets stomach and makes feel uncomfortable  . Codeine Anxiety    Other reaction(s): Other (See Comments) Hallucinations  Hallucinations     Immunization History  Administered Date(s) Administered  . Influenza Split  12/25/2010, 12/04/2011, 01/02/2013, 12/24/2016  . Influenza Whole 12/28/2005, 12/24/2008  . Influenza,inj,Quad PF,6+ Mos 01/04/2014, 12/08/2014  . Influenza-Unspecified 11/25/2015  . Pneumococcal Conjugate-13 02/02/2013  . Pneumococcal Polysaccharide-23 06/06/2015    Past Medical History:  Diagnosis Date  . Arthritis   . BOOP (bronchiolitis obliterans with organizing pneumonia) ([place])   . Bronchitis    hx of   . Chronic kidney disease   . Claustrophobia   . Dysrhythmia   . Hypertension   . PONV (postoperative nausea and vomiting)   . Pulmonary nodules     Tobacco History: Social History   Tobacco Use  Smoking Status Passive Smoke Exposure - Never Smoker  Smokeless Tobacco Never Used  Tobacco Comment   From father   Counseling given: Not Answered Comment: From father   Outpatient Encounter Medications as of 04/18/2017  Medication Sig  . acetaminophen (TYLENOL) 325 MG tablet Take 325 mg by mouth every 6 (six) hours as needed.  . Cholecalciferol (VITAMIN D-3) 1000 UNITS CAPS Take 1 capsule by mouth daily.  . fluticasone (FLONASE) 50 MCG/ACT nasal spray Place 2 sprays into the nose daily as needed for allergies or rhinitis.   Marland Kitchen loratadine (CLARITIN) 10 MG tablet Take 10 mg by mouth daily as needed for allergies.  Marland Kitchen LORazepam (ATIVAN) 0.5 MG tablet Take 0.5 mg by mouth at bedtime.   . Multiple Vitamin (MULTIVITAMIN) tablet Take 1 tablet by mouth daily as  needed.   . naphazoline-glycerin (CLEAR EYES) 0.012-0.2 % SOLN Place 1-2 drops into both eyes every 4 (four) hours as needed for irritation.  Marland Kitchen olmesartan (BENICAR) 5 MG tablet Take 5-10 mg by mouth every morning. Pt takes 2 tablets a day  . Probiotic Product (PROBIOTIC DAILY PO) Take by mouth daily.  . Psyllium (METAMUCIL) 28.3 % POWD Take by mouth as needed.  . vitamin B-12 (CYANOCOBALAMIN) 1000 MCG tablet Take 1,000 mcg by mouth daily as needed.   . [DISCONTINUED] venlafaxine XR (EFFEXOR-XR) 37.5 MG 24 hr capsule TAKE 1  CAPSULE BY MOUTH EVERY DAY WITH FOOD   No facility-administered encounter medications on file as of 04/18/2017.      Review of Systems  Constitutional:   No  weight loss, night sweats,  Fevers, chills, fatigue, or  lassitude.  HEENT:   No headaches,  Difficulty swallowing,  Tooth/dental problems, or  Sore throat,                No sneezing, itching, ear ache,  +nasal congestion, post nasal drip,   CV:  No chest pain,  Orthopnea, PND, swelling in lower extremities, anasarca, dizziness, palpitations, syncope.   GI  No heartburn, indigestion, abdominal pain, nausea, vomiting, diarrhea, change in bowel habits, loss of appetite, bloody stools.   Resp:   No chest wall deformity  Skin: no rash or lesions.  GU: no dysuria, change in color of urine, no urgency or frequency.  No flank pain, no hematuria   MS:  No joint pain or swelling.  No decreased range of motion.  No back pain.    Physical Exam  BP (!) 142/68 (BP Location: Left Arm, Cuff Size: Normal)   Pulse 89   Ht 5\' 3"  (1.6 m)   Wt 114 lb 3.2 oz (51.8 kg)   SpO2 98%   BMI 20.23 kg/m   GEN: A/Ox3; pleasant , NAD, elderly    HEENT:  [place]/AT,  EACs-clear, TMs-wnl, NOSE-clear, THROAT-clear, no lesions, no postnasal drip or exudate noted.   NECK:  Supple w/ fair ROM; no JVD; normal carotid impulses w/o bruits; no thyromegaly or nodules palpated; no lymphadenopathy.    RESP  Clear  P & A; w/o, wheezes/ rales/ or rhonchi. no accessory muscle use, no dullness to percussion  CARD:  RRR, no m/r/g, no peripheral edema, pulses intact, no cyanosis or clubbing.  GI:   Soft & nt; nml bowel sounds; no organomegaly or masses detected.   Musco: Warm bil, no deformities or joint swelling noted.   Neuro: alert, no focal deficits noted.    Skin: Warm, no lesions or rashes    Lab Results:  CBC  BMET  No results found for: BNP  ProBNP No results found for: PROBNP  Imaging: No results found.   Assessment & Plan:   COPD  mixed type ([place]) Increased symptoms off Spiriva  Will restart spiriva  Add mucinex  To call back if not improving   Plan  Patient Instructions  Restart Spiriva 1 puff daily .  USe Mucinex DM Twice daily  As needed -Liquid  Follow up with Dr. Chase Caller in 6 months and As needed        Allergic rhinitis Control for tirggers claritin and flonase      Rexene Edison, NP 04/18/2017

## 2017-04-18 NOTE — Assessment & Plan Note (Signed)
Increased symptoms off Spiriva  Will restart spiriva  Add mucinex  To call back if not improving   Plan  Patient Instructions  Restart Spiriva 1 puff daily .  USe Mucinex DM Twice daily  As needed -Liquid  Follow up with Dr. Chase Caller in 6 months and As needed

## 2017-10-28 ENCOUNTER — Encounter: Payer: Self-pay | Admitting: Internal Medicine

## 2017-10-28 ENCOUNTER — Ambulatory Visit: Payer: Medicare Other | Admitting: Internal Medicine

## 2017-10-28 VITALS — BP 154/94 | HR 92 | Ht 63.0 in | Wt 113.0 lb

## 2017-10-28 DIAGNOSIS — R0600 Dyspnea, unspecified: Secondary | ICD-10-CM | POA: Diagnosis not present

## 2017-10-28 DIAGNOSIS — R05 Cough: Secondary | ICD-10-CM

## 2017-10-28 DIAGNOSIS — R059 Cough, unspecified: Secondary | ICD-10-CM

## 2017-10-28 NOTE — Patient Instructions (Addendum)
ICD-10-CM   1. Cough R05   2. Dyspnea, unspecified type R06.00     symptoms are mild and stable and glad overall you feel stable BAsed on old PFT and 2018 CT chest I do not see evidence of copd YOu have had chronic bronchitis in past But now you are well   Plan We will keep an eye on your symptoms YOu can try inspiratory muscle training device to help your breathing stamina  - take picture  - can try it ten times a day Continue spiriva as before   Followup 12 months or sooner

## 2017-10-28 NOTE — Progress Notes (Signed)
Subjective:     Patient ID: [name], female   DOB: 02/01/1931, 84 y.o.   MRN: [id number]  HPI Patient presents with  . Follow-up    COPD     Referring provider: Maurice Small, MD  HPI: 84 yo female never smoker followed for mixed COPD/Asthma , chronic cough and allergic rhinitis  TEST  06/06/15 Alpha-1 antitrypsin: MM (132)  CT CHEST W/O 05/16/16 :  No parenchymal nodule, mass, or opacity appreciated. No pleural effusion or thickening. No pericardial effusion. No pathologic mediastinal adenopathy.  06/19/16: FVC 2.13 L (95%) FEV1 1.54 L (93%) FEV1/FVC 0.72 FEF 25-75 0.97 L (87%) no bronchodilator response 09/12/15: FVC 2.13 L (94%) FEV1 1.54 L (92%) FEV1/FVC 0.72 FEF 25-75 1.01 L (80%) no bronchodilator response TLC 4.18 L (100%) RV 116% ERV 169% DLCO corrected 79% (hemoglobin 12.3) 07/21/12: FVC 2.25 L (89%) FEV1 1.45 L (78%) FEV1/FVC 0.64 FEF 25-75 0.77 L (53%)  Previous evaluation at [facility] voice center.     04/18/2017 Follow up : COPD and AR  Pt returns for a follow up . She has Mild COPD and was previously on Spriiva but has been off for a few months. Says over last month breathing has not been doing as well and cough has picked up . She says she has a chronic cough which is worse for last few week. All the rainy weather seems to make it worse.  Says that spiriva has been the best help in the past , seems to help her cough and breathing .  She denies chest pain, orthopnea, edema or fever.  No discolored mucus.    OV 10/28/2017  Chief Complaint  Patient presents with  . Follow-up    copd; nodules;chronic cough worse with humidity;no SOB     [name] , 84 y.o. , with dob 22-Sep-1930 and female ,Not Hispanic or Latino from [place] [id number] - presents to lung clinic for based on chart review for history of COPD and chronic cough.   In review of the chart she's had normal DLCO hardly ever smoke normal forward function test  and CT scan of the chest in 2018 without any description of emphysema. Therefore I'm not so sure she has COPD. Nevertheless she has Spiriva with her when she only uses as needed. She also tells the medical assistant that she's had lung nodules but in review of the CT chest 2018 there are no lung nodules. He tells me that the main issue is that she's had recurrent chronic bronchitis which for many years Dr. Joya Gaskins took care of and resolved it.  Currently she says she feels pretty good. She's wondering about the need for regular follow-up. Her main issue that she is dealing with chronic back pain. Because she had a sharp level of COPD related to COPD with symptom score and is documented below but she says she is feeling really good and hardly having any symptoms. Walk test  - room air - 99% lowest 185 feet x 3 laps    CAT COPD Symptom & Quality of Life Score (GSK trademark) 0 is no burden. 5 is highest burden 10/28/2017   Never Cough -> Cough all the time 2  No phlegm in chest -> Chest is full of phlegm 1  No chest tightness -> Chest feels very tight 0  No dyspnea for 1 flight stairs/hill -> Very dyspneic for 1 flight of stairs 3  No limitations for ADL at home ->  Very limited with ADL at home 1  Confident leaving home -> Not at all confident leaving home 0  Sleep soundly -> Do not sleep soundly because of lung condition 2  Lots of Energy -> No energy at all 2  TOTAL Score (max 40)  11       has a past medical history of Arthritis, BOOP (bronchiolitis obliterans with organizing pneumonia) ([place]), Bronchitis, Chronic kidney disease, Claustrophobia, Dysrhythmia, Hypertension, PONV (postoperative nausea and vomiting), and Pulmonary nodules.   reports that she is a non-smoker but has been exposed to tobacco smoke. She has never used smokeless tobacco.  Past Surgical History:  Procedure Laterality Date  . APPENDECTOMY    . cataract surgery      bilateral   . CHOLECYSTECTOMY    . CYSTOSCOPY WITH  RETROGRADE PYELOGRAM, URETEROSCOPY AND STENT PLACEMENT Right 08/20/2014   Procedure: CYSTOSCOPY WITH RIGHT RETROGRADE PYELOGRAM, RIGHT DIAGNOSTIC URETEROSCOPY, RIGHT URETERAL STENT PLACEMENT;  Surgeon: Alexis Frock, MD;  Location: [facility];  Service: Urology;  Laterality: Right;  . HIP SURGERY     partial replacement of left hip   . meniscus tear surgery in right knee     . ROBOT ASSISTED LAPAROSCOPIC NEPHRECTOMY Right 10/01/2014   Procedure: ROBOTIC ASSISTED LAPAROSCOPIC NEPHRECTOMY LYSIS OF ADHESIONS;  Surgeon: Alexis Frock, MD;  Location: [facility];  Service: Urology;  Laterality: Right;  . TONSILLECTOMY      Allergies  Allergen Reactions  . Tramadol Hcl Nausea And Vomiting  . Tramadol Hcl Nausea And Vomiting  . Doxycycline Other (See Comments)    Other reaction(s): GI Upset (intolerance) Upsets stomach and makes feel uncomfortable Upsets stomach and makes feel uncomfortable  . Codeine Anxiety    Other reaction(s): Other (See Comments) Hallucinations  Hallucinations     Immunization History  Administered Date(s) Administered  . Influenza Split 12/25/2010, 12/04/2011, 01/02/2013, 12/24/2016  . Influenza Whole 12/28/2005, 12/24/2008  . Influenza,inj,Quad PF,6+ Mos 01/04/2014, 12/08/2014  . Influenza-Unspecified 11/25/2015  . Pneumococcal Conjugate-13 02/02/2013  . Pneumococcal Polysaccharide-23 06/06/2015    Family History  Problem Relation Age of Onset  . Hypertension Mother   . Cancer Mother   . Heart failure Father   . Emphysema Father   . Prostate cancer Brother   . CAD Brother        CABG     Current Outpatient Medications:  .  acetaminophen (TYLENOL) 325 MG tablet, Take 325 mg by mouth every 6 (six) hours as needed., Disp: , Rfl:  .  Cholecalciferol (VITAMIN D-3) 1000 UNITS CAPS, Take 1 capsule by mouth daily., Disp: , Rfl:  .  fluticasone (FLONASE) 50 MCG/ACT nasal spray, Place 2 sprays into the nose daily as needed for allergies or rhinitis. , Disp: , Rfl:  .   loratadine (CLARITIN) 10 MG tablet, Take 10 mg by mouth daily as needed for allergies., Disp: , Rfl:  .  LORazepam (ATIVAN) 0.5 MG tablet, Take 0.5 mg by mouth at bedtime. , Disp: , Rfl:  .  Multiple Vitamin (MULTIVITAMIN) tablet, Take 1 tablet by mouth daily as needed. , Disp: , Rfl:  .  naphazoline-glycerin (CLEAR EYES) 0.012-0.2 % SOLN, Place 1-2 drops into both eyes every 4 (four) hours as needed for irritation., Disp: , Rfl:  .  olmesartan (BENICAR) 5 MG tablet, Take 5-10 mg by mouth every morning. Pt takes 2 tablets a day, Disp: , Rfl:  .  Probiotic Product (PROBIOTIC DAILY PO), Take by mouth daily., Disp: , Rfl:  .  Psyllium (METAMUCIL)  28.3 % POWD, Take by mouth as needed., Disp: , Rfl:  .  tiotropium (SPIRIVA) 18 MCG inhalation capsule, Place 1 capsule (18 mcg total) into inhaler and inhale daily., Disp: 30 capsule, Rfl: 5 .  vitamin B-12 (CYANOCOBALAMIN) 1000 MCG tablet, Take 1,000 mcg by mouth daily as needed. , Disp: , Rfl:    Review of Systems     Objective:   Physical Exam  Constitutional: She is oriented to person, place, and time. She appears well-developed and well-nourished. No distress.  thin  HENT:  Head: Normocephalic and atraumatic.  Right Ear: External ear normal.  Left Ear: External ear normal.  Mouth/Throat: Oropharynx is clear and moist. No oropharyngeal exudate.  occ clears throast  Eyes: Pupils are equal, round, and reactive to light. Conjunctivae and EOM are normal. Right eye exhibits no discharge. Left eye exhibits no discharge. No scleral icterus.  Neck: Normal range of motion. Neck supple. No JVD present. No tracheal deviation present. No thyromegaly present.  Cardiovascular: Normal rate, regular rhythm, normal heart sounds and intact distal pulses. Exam reveals no gallop and no friction rub.  No murmur heard. Pulmonary/Chest: Effort normal and breath sounds normal. No respiratory distress. She has no wheezes. She has no rales. She exhibits no tenderness.   Abdominal: Soft. Bowel sounds are normal. She exhibits no distension and no mass. There is no tenderness. There is no rebound and no guarding.  scaphoid  Musculoskeletal: Normal range of motion. She exhibits no edema or tenderness.  Stiff spine Mild antalgic gait  Lymphadenopathy:    She has no cervical adenopathy.  Neurological: She is alert and oriented to person, place, and time. She has normal reflexes. No cranial nerve deficit. She exhibits normal muscle tone. Coordination normal.  Skin: Skin is warm and dry. No rash noted. She is not diaphoretic. No erythema. No pallor.  Psychiatric: She has a normal mood and affect. Her behavior is normal. Judgment and thought content normal.  Vitals reviewed.    Vitals:   10/28/17 0940  BP: (!) 154/94  Pulse: 92  SpO2: 94%  Weight: 113 lb (51.3 kg)  Height: 5\' 3"  (1.6 m)    Estimated body mass index is 20.02 kg/m as calculated from the following:   Height as of this encounter: 5\' 3"  (1.6 m).   Weight as of this encounter: 113 lb (51.3 kg).     Assessment:       ICD-10-CM   1. Cough R05   2. Dyspnea, unspecified type R06.00        Plan:      symptoms are mild and stable and glad overall you feel stable BAsed on old PFT and 2018 CT chest I do not see evidence of copd YOu have had chronic bronchitis in past But now you are well   Plan We will keep an eye on your symptoms YOu can try inspiratory muscle training device to help your breathing stamina  - take picture  - can try it ten times a day Spriva cntinue as before as needed  Followup 12 months or sooner   (> 50% of this 15 min visit spent in face to face counseling or/and coordination of care by this undersigned MD - Dr Brand Males. This includes one or more of the following documented above: discussion of test results, diagnostic or treatment recommendations, prognosis, risks and benefits of management options, instructions, education, compliance or risk-factor  reduction)   Dr. Brand Males, M.D., [facility].C.P Pulmonary and Critical Care Medicine Staff  Physician, [place] Director - Interstitial Lung Disease  Program  Pulmonary [place] at [address], [place], [address]  Pager: [phone], If no answer or between  15:00h - 7:00h: call [phone] Telephone: [phone]

## 2019-07-30 ENCOUNTER — Ambulatory Visit: Payer: Medicare Other | Admitting: Internal Medicine

## 2019-07-30 ENCOUNTER — Other Ambulatory Visit: Payer: Self-pay

## 2019-07-30 ENCOUNTER — Encounter: Payer: Self-pay | Admitting: Internal Medicine

## 2019-07-30 VITALS — BP 112/68 | HR 77 | Temp 97.9°F | Ht 63.0 in | Wt 109.8 lb

## 2019-07-30 DIAGNOSIS — R059 Cough, unspecified: Secondary | ICD-10-CM

## 2019-07-30 DIAGNOSIS — R0982 Postnasal drip: Secondary | ICD-10-CM

## 2019-07-30 DIAGNOSIS — R05 Cough: Secondary | ICD-10-CM

## 2019-07-30 NOTE — Patient Instructions (Signed)
ICD-10-CM   1. Cough  R05   2. Post-nasal drip  R09.82     -Your current cough is mild and probably mostly due to postnasal drip.  There is no evidence of COPD on a breathing test of her smoking history or CT scan of the chest 2018  -There is no lung nodule mentioned in 2018  Plan -Stop Spiriva [no need to restart] -Continue nasal steroid -No need for further follow-up of the CT chest  Follow-up -15-minute phone visit in 6 months with Dr. Chase Caller -Regular office visit in 12 months with Dr. Chase Caller

## 2019-07-30 NOTE — Progress Notes (Signed)
Subjective:     Patient ID: [name], female   DOB: 1930/08/13, 86 y.o.   MRN: [id number]  HPI Patient presents with  . Follow-up    COPD     Referring provider: Maurice Small, MD  HPI: 86 yo female never smoker followed for mixed COPD/Asthma , chronic cough and allergic rhinitis  TEST  06/06/15 Alpha-1 antitrypsin: MM (132)  CT CHEST W/O 05/16/16 :  No parenchymal nodule, mass, or opacity appreciated. No pleural effusion or thickening. No pericardial effusion. No pathologic mediastinal adenopathy.  06/19/16: FVC 2.13 L (95%) FEV1 1.54 L (93%) FEV1/FVC 0.72 FEF 25-75 0.97 L (87%) no bronchodilator response 09/12/15: FVC 2.13 L (94%) FEV1 1.54 L (92%) FEV1/FVC 0.72 FEF 25-75 1.01 L (80%) no bronchodilator response TLC 4.18 L (100%) RV 116% ERV 169% DLCO corrected 79% (hemoglobin 12.3) 07/21/12: FVC 2.25 L (89%) FEV1 1.45 L (78%) FEV1/FVC 0.64 FEF 25-75 0.77 L (53%)  Previous evaluation at [facility] voice center.     04/18/2017 Follow up : COPD and AR  Pt returns for a follow up . She has Mild COPD and was previously on Spriiva but has been off for a few months. Says over last month breathing has not been doing as well and cough has picked up . She says she has a chronic cough which is worse for last few week. All the rainy weather seems to make it worse.  Says that spiriva has been the best help in the past , seems to help her cough and breathing .  She denies chest pain, orthopnea, edema or fever.  No discolored mucus.    OV 07/30/2019  Chief Complaint  Patient presents with  . Follow-up    Pt last seen 10/28/17. Pt states she has been doing good since last visit and denies any complaints.     [name] , 86 y.o. , with dob 1930/05/26 and female ,Not Hispanic or Latino from [place] [id number] - presents to lung clinic for based on chart review for history of COPD and chronic cough.   Patient is being treated for chronic bronchitis.  She is out of her Spiriva and ask for refills. Denies any changes since her last office visit.  She has postnasal drip and is treating this with over-the-counter medications. She ask about lung nodules on previous imaging and if she needs to have them followed up. I reviewed the imaging and previous findings. In 2008 lung nodules were noted and then followed up in 2011 CT chest.  At that time the nodule seen stable and further follow-up was not recommended.  Patient had imaging with CT Chest WO contrast in 2018 and the nodules were not seen.    CAT COPD Symptom & Quality of Life Score (GSK trademark) 0 is no burden. 5 is highest burden 07/30/2019   Never Cough -> Cough all the time 2  No phlegm in chest -> Chest is full of phlegm 1  No chest tightness -> Chest feels very tight 0  No dyspnea for 1 flight stairs/hill -> Very dyspneic for 1 flight of stairs 3  No limitations for ADL at home -> Very limited with ADL at home 1  Confident leaving home -> Not at all confident leaving home 0  Sleep soundly -> Do not sleep soundly because of lung condition 2  Lots of Energy -> No energy at all 2  TOTAL Score (max 40)  11     Current  Outpatient Medications:  .  acetaminophen (TYLENOL) 325 MG tablet, Take 325 mg by mouth every 6 (six) hours as needed., Disp: , Rfl:  .  amLODipine (NORVASC) 5 MG tablet, Take 5 mg by mouth daily., Disp: , Rfl:  .  Cholecalciferol (VITAMIN D-3) 1000 UNITS CAPS, Take 1 capsule by mouth daily., Disp: , Rfl:  .  fluticasone (FLONASE) 50 MCG/ACT nasal spray, Place 2 sprays into the nose daily as needed for allergies or rhinitis. , Disp: , Rfl:  .  gabapentin (NEURONTIN) 100 MG capsule, Take 200 mg by mouth 3 (three) times daily as needed., Disp: , Rfl:  .  loratadine (CLARITIN) 10 MG tablet, Take 10 mg by mouth daily as needed for allergies., Disp: , Rfl:  .  LORazepam (ATIVAN) 0.5 MG tablet, Take 0.5 mg by mouth at bedtime. , Disp: , Rfl:  .  Multiple Vitamin (MULTIVITAMIN)  tablet, Take 1 tablet by mouth daily as needed. , Disp: , Rfl:  .  naphazoline-glycerin (CLEAR EYES) 0.012-0.2 % SOLN, Place 1-2 drops into both eyes every 4 (four) hours as needed for irritation., Disp: , Rfl:  .  olmesartan (BENICAR) 5 MG tablet, Take 5-10 mg by mouth every morning. Pt takes 2 tablets a day, Disp: , Rfl:  .  Probiotic Product (PROBIOTIC DAILY PO), Take by mouth daily., Disp: , Rfl:  .  Psyllium (METAMUCIL) 28.3 % POWD, Take by mouth as needed., Disp: , Rfl:  .  tiotropium (SPIRIVA) 18 MCG inhalation capsule, Place 1 capsule (18 mcg total) into inhaler and inhale daily., Disp: 30 capsule, Rfl: 5 .  vitamin B-12 (CYANOCOBALAMIN) 1000 MCG tablet, Take 1,000 mcg by mouth daily as needed. , Disp: , Rfl:    No chest pain, shortness of breath, fever, or chills.     Objective:   Vitals:   07/30/19 0931  BP: 112/68  Pulse: 77  Temp: 97.9 F (36.6 C)  SpO2: 98%    General: NAD, nl appearance HE: Normocephalic, atraumatic , EOMI, Conjunctivae normal ENT: No congestion, no rhinorrhea, no exudate , mild erythema  Cardiovascular: Normal rate, regular rhythm.  No murmurs, rubs, or gallops Pulmonary : Effort normal, breath sounds normal. No wheezes, rales, or rhonchi Abdominal: soft, nontender,  bowel sounds present Musculoskeletal: no swelling , deformity, injury ,or tenderness in extremities, Skin: Warm, dry , no bruising, erythema, or rash Psychiatric/Behavioral:  normal mood, normal behavior      Assessment:    Chronic Bronchitis Patient has chronic bronchitis and this has a low impact on her according to CAT score, 11. We will refill her Spiriva today. Do not recommend imaging her chest for lung nodules as previous imaging was stable and then nodules were not seen on latest imaging.      Plan:      - Continue Spiriva - Follow up in 12 months or sooner if needed.  Tamsen Snider, MD PGY1   xxxxxxxxxxxxxxxxxxxxxxxxxxxxx  STAFF MD ATTESTATION & SIGNATURE   STAFF  NOTE: I, Dr Ann Lions have personally reviewed patient's available data, including medical history, events of note, physical examination and test results as part of my evaluation. I have discussed with resident/NP and other care providers such as pharmacist, RN and RRT.  In addition,  I personally evaluated patient and elicited key findings of   S: Date of admit (Not on file) with LOS 0 for today 07/30/2019 : [name] is  -presents for chronic cough.  She again tells me that she never smoked  in her entire life.  Her CT scan of the chest in 2018 did not show any emphysema.  Her spirometry in 2018 was normal.  Currently her cough is very mild.  She says the Spiriva helps her but then she also said that she is not taking her Spiriva for many months and she has not noticed any difference.  She is willing to try without Spiriva.  She says that she is 86 years old and at this point she wants to be on fewer medications.  She does have postnasal drip and is on nasal steroids.  She wants to continue that.  She has a history of nodules per 2018 CT chest did not show that.  No further follow-up was recommended at that time.  She prefers not to have a follow-up CT chest as part of her goals of care  Socially: She is well known to Kirstie Mirza who is a patient of mine.  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  Her son is Dr. Ralene Ok who is infectious disease professor at [employer].  O:  Blood pressure 112/68, pulse 77, temperature 97.9 F (36.6 C), temperature source Temporal, height 5\' 3"  (1.6 m), weight 109 lb 12.8 oz (49.8 kg), SpO2 98 %.   Frail female pleasant.  Cheerful.  Clear to auscultation bilaterally normal heart sounds.   LABS    PULMONARY No results for input(s): PHART, PCO2ART, PO2ART, HCO3, TCO2, O2SAT in the last 168 hours.  Invalid input(s): PCO2, PO2  CBC No results for input(s): HGB, HCT, WBC, PLT in the last 168 hours.  COAGULATION No results  for input(s): INR in the last 168 hours.  CARDIAC  No results for input(s): TROPONINI in the last 168 hours. No results for input(s): PROBNP in the last 168 hours.   CHEMISTRY No results for input(s): NA, K, CL, CO2, GLUCOSE, BUN, CREATININE, CALCIUM, MG, PHOS in the last 168 hours. CrCl cannot be calculated (Patient's most recent lab result is older than the maximum 21 days allowed.).   LIVER No results for input(s): AST, ALT, ALKPHOS, BILITOT, PROT, ALBUMIN, INR in the last 168 hours.   INFECTIOUS No results for input(s): LATICACIDVEN, PROCALCITON in the last 168 hours.   ENDOCRINE CBG (last 3)  No results for input(s): GLUCAP in the last 72 hours.       IMAGING x24h  - image(s) personally visualized  -   highlighted in bold No results found.    A:  -Chronic cough: She does not have COPD.  It is very mild.  We will discontinue Spiriva -Postnasal drip: Continue nasal steroids -Nodule: Not present in CT chest 2018.  No CT imaging needed  Follow-up -6 months telephone visit 15 minutes -12 months face-to-face as per her goals of care   Anti-infectives (From admission, onward)   None       Rest per NP/medical resident whose note is outlined above and that I agree with      Dr. Brand Males, M.D., [facility].C.P Pulmonary and Critical Care Medicine Staff Physician [facility] Pager: [phone], If no answer or between  15:00h - 7:00h: call [phone]  07/30/2019 10:26 AM

## 2019-07-30 NOTE — Addendum Note (Signed)
Addended by: Lorretta Harp on: 07/30/2019 10:32 AM   Modules accepted: Orders

## 2020-05-06 ENCOUNTER — Other Ambulatory Visit (HOSPITAL_COMMUNITY): Payer: Self-pay | Admitting: Family Medicine

## 2020-05-06 DIAGNOSIS — R609 Edema, unspecified: Secondary | ICD-10-CM | POA: Diagnosis not present

## 2020-05-06 DIAGNOSIS — R0600 Dyspnea, unspecified: Secondary | ICD-10-CM

## 2020-05-06 DIAGNOSIS — R0609 Other forms of dyspnea: Secondary | ICD-10-CM

## 2020-05-09 ENCOUNTER — Other Ambulatory Visit: Payer: Self-pay | Admitting: Family Medicine

## 2020-05-09 DIAGNOSIS — E2839 Other primary ovarian failure: Secondary | ICD-10-CM

## 2020-05-10 ENCOUNTER — Telehealth: Payer: Self-pay

## 2020-05-10 NOTE — Telephone Encounter (Signed)
NOTES ON FILE FROM [facility] [phone], SENT REFERRAL TO SCHEDULING 

## 2020-05-30 ENCOUNTER — Ambulatory Visit (HOSPITAL_COMMUNITY): Payer: Medicare Other | Attending: Cardiology

## 2020-05-30 ENCOUNTER — Other Ambulatory Visit: Payer: Self-pay

## 2020-05-30 DIAGNOSIS — R0600 Dyspnea, unspecified: Secondary | ICD-10-CM

## 2020-05-30 DIAGNOSIS — R0609 Other forms of dyspnea: Secondary | ICD-10-CM

## 2020-05-30 LAB — ECHOCARDIOGRAM COMPLETE
Area-P 1/2: 2.96 cm2
S' Lateral: 2.3 cm

## 2020-06-06 ENCOUNTER — Telehealth: Payer: Self-pay

## 2020-06-06 NOTE — Telephone Encounter (Signed)
REFERRAL ON FILE FROM [facility] [phone], SENT REFERRAL TO SCHEDULING

## 2020-06-16 ENCOUNTER — Telehealth: Payer: Self-pay

## 2020-06-16 NOTE — Telephone Encounter (Signed)
Faxed notes to [facility]

## 2020-06-20 DIAGNOSIS — K219 Gastro-esophageal reflux disease without esophagitis: Secondary | ICD-10-CM | POA: Diagnosis not present

## 2020-06-20 DIAGNOSIS — I10 Essential (primary) hypertension: Secondary | ICD-10-CM | POA: Diagnosis not present

## 2020-06-20 DIAGNOSIS — Q600 Renal agenesis, unilateral: Secondary | ICD-10-CM | POA: Diagnosis not present

## 2020-06-20 DIAGNOSIS — J449 Chronic obstructive pulmonary disease, unspecified: Secondary | ICD-10-CM | POA: Diagnosis not present

## 2020-06-20 DIAGNOSIS — D3001 Benign neoplasm of right kidney: Secondary | ICD-10-CM | POA: Diagnosis not present

## 2020-06-20 DIAGNOSIS — E785 Hyperlipidemia, unspecified: Secondary | ICD-10-CM | POA: Diagnosis not present

## 2020-06-20 DIAGNOSIS — Z0000 Encounter for general adult medical examination without abnormal findings: Secondary | ICD-10-CM | POA: Diagnosis not present

## 2020-07-07 NOTE — Progress Notes (Signed)
Cardiology Office Note:   Date:  07/08/2020  NAME:  [name]    MRN: [id number] DOB:  1931-03-26   PCP:  Maurice Small, MD  Cardiologist:  No primary care provider on file.  Electrophysiologist:  None   Referring MD: Maurice Small, MD   Chief Complaint  Patient presents with  . Leg Swelling    History of Present Illness:   [name] is a 87 y.o. female with a hx of COPD who is being seen today for the evaluation of edema at the request of Maurice Small, MD.  She reports for the last 12 months has had swelling in her legs.  Symptoms are worse later in the afternoon and evening.  She reports symptoms improved with elevating her legs.  This has coincided with starting amlodipine.  Apparently her blood pressure was difficult to control prior to being on amlodipine.  She describes no increase shortness of breath.  No chest pain.  EKG in office demonstrates sinus rhythm with no acute ischemic changes or evidence of infarction.  She had an echocardiogram performed that was ordered by her primary care physician that showed normal LV function.  She had normal heart pressure.  She had no significant valvular heart disease.  Her blood pressure in the office today is 125/72.  She denies any symptoms in the office.  She reports she does walking at her retirement facility.  No issues doing this.  I did review her CT scan without contrast from 2018.  This shows no evidence of coronary calcium.  She has a macular cholesterol with an HDL cholesterol of 96.  She is a never smoker but did have passive secondhand smoke exposure as a child.  No drug use or alcohol reported.  She has a history of lung nodules what she tells me.  No significant COPD.  She does not require significant inhalers.  Her son is an infectious disease physician at [employer].  She overall is doing well without any major limitations.  There is no evidence of congestive heart failure examination.  Problem List 1. COPD 2. HTN 3.   Total cholesterol 165, HDL 96, LDL 60, triglycerides 46  Past Medical History: Past Medical History:  Diagnosis Date  . Arthritis   . BOOP (bronchiolitis obliterans with organizing pneumonia) ([place])   . Bronchitis    hx of   . Chronic kidney disease   . Claustrophobia   . Dysrhythmia   . Hypertension   . PONV (postoperative nausea and vomiting)   . Pulmonary nodules     Past Surgical History: Past Surgical History:  Procedure Laterality Date  . APPENDECTOMY    . cataract surgery      bilateral   . CHOLECYSTECTOMY    . CYSTOSCOPY WITH RETROGRADE PYELOGRAM, URETEROSCOPY AND STENT PLACEMENT Right 08/20/2014   Procedure: CYSTOSCOPY WITH RIGHT RETROGRADE PYELOGRAM, RIGHT DIAGNOSTIC URETEROSCOPY, RIGHT URETERAL STENT PLACEMENT;  Surgeon: Alexis Frock, MD;  Location: [facility];  Service: Urology;  Laterality: Right;  . HIP SURGERY     partial replacement of left hip   . meniscus tear surgery in right knee     . ROBOT ASSISTED LAPAROSCOPIC NEPHRECTOMY Right 10/01/2014   Procedure: ROBOTIC ASSISTED LAPAROSCOPIC NEPHRECTOMY LYSIS OF ADHESIONS;  Surgeon: Alexis Frock, MD;  Location: [facility];  Service: Urology;  Laterality: Right;  . TONSILLECTOMY      Current Medications: Current Meds  Medication Sig  . acetaminophen (TYLENOL) 325 MG tablet Take 325 mg by mouth every  6 (six) hours as needed.  Marland Kitchen amLODipine (NORVASC) 2.5 MG tablet Take 1 tablet (2.5 mg total) by mouth daily.  . Cholecalciferol (VITAMIN D-3) 1000 UNITS CAPS Take 1 capsule by mouth daily.  . fluticasone (FLONASE) 50 MCG/ACT nasal spray Place 2 sprays into the nose daily as needed for allergies or rhinitis.   Marland Kitchen gabapentin (NEURONTIN) 100 MG capsule Take 300 mg by mouth 3 (three) times daily as needed.  . loratadine (CLARITIN) 10 MG tablet Take 10 mg by mouth daily as needed for allergies.  Marland Kitchen LORazepam (ATIVAN) 0.5 MG tablet Take 0.5 mg by mouth at bedtime.   . Multiple Vitamin (MULTIVITAMIN) tablet Take 1 tablet by mouth  daily as needed.   . naphazoline-glycerin (CLEAR EYES) 0.012-0.2 % SOLN Place 1-2 drops into both eyes every 4 (four) hours as needed for irritation.  Marland Kitchen olmesartan (BENICAR) 5 MG tablet Take 5-10 mg by mouth every morning. Pt takes 2 tablets a day  . Probiotic Product (PROBIOTIC DAILY PO) Take by mouth daily.  . Psyllium 28.3 % POWD Take by mouth as needed.  . vitamin B-12 (CYANOCOBALAMIN) 1000 MCG tablet Take 1,000 mcg by mouth daily as needed.   . [DISCONTINUED] amLODipine (NORVASC) 5 MG tablet Take 5 mg by mouth daily.     Allergies:    Tramadol hcl, Tramadol hcl, Doxycycline, and Codeine   Social History: Social History   Socioeconomic History  . Marital status: Married    Spouse name: Not on file  . Number of children: Not on file  . Years of education: Not on file  . Highest education level: Not on file  Occupational History  . Not on file  Tobacco Use  . Smoking status: Passive Smoke Exposure - Never Smoker  . Smokeless tobacco: Never Used  . Tobacco comment: From father  Substance and Sexual Activity  . Alcohol use: Yes    Alcohol/week: 0.0 standard drinks    Comment: occasional glass of wine   . Drug use: No  . Sexual activity: Not on file  Other Topics Concern  . Not on file  Social History Narrative   Patient is originally from [facility]. She has always lived in [place]. She has prior travel to [place], [place], [place], & [place]. 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 Remote canary exposure as a child. No mold exposure. No asbestos exposure.    Social Determinants of Health   Financial Resource Strain: Not on file  Food Insecurity: Not on file  Transportation Needs: Not on file  Physical Activity: Not on file  Stress: Not on file  Social Connections: Not on file     Family History: The patient's family history includes CAD in her brother; Cancer in her mother; Emphysema in her father; Heart failure in her father; Hypertension in her mother; Prostate  cancer in her brother.  ROS:   All other ROS reviewed and negative. Pertinent positives noted in the HPI.     EKGs/Labs/Other Studies Reviewed:   The following studies were personally reviewed by me today:  EKG:  EKG is ordered today.  The ekg ordered today demonstrates normal sinus rhythm heart rate 72 with single PAC, no acute ischemic changes or evidence of infarction, and was personally reviewed by me.   TTE 05/30/2020 1. Left ventricular ejection fraction, by estimation, is 55 to 60%. The  left ventricle has normal function. The left ventricle has no regional  wall motion abnormalities. Left ventricular diastolic parameters are  consistent with  Grade I diastolic  dysfunction (impaired relaxation).  2. Right ventricular systolic function is normal. The right ventricular  size is normal. There is normal pulmonary artery systolic pressure. The  estimated right ventricular systolic pressure is 46.9 mmHg.  3. The mitral valve is normal in structure. Trivial mitral valve  regurgitation. No evidence of mitral stenosis.  4. Tricuspid valve regurgitation is mild to moderate.  5. The aortic valve is tricuspid. Aortic valve regurgitation is not  visualized. Mild aortic valve sclerosis is present, with no evidence of  aortic valve stenosis.  6. The inferior vena cava is dilated in size with >50% respiratory  variability, suggesting right atrial pressure of 8 mmHg.   Recent Labs: No results found for requested labs within last 8760 hours.   Recent Lipid Panel No results found for: CHOL, TRIG, HDL, CHOLHDL, VLDL, LDLCALC, LDLDIRECT  Physical Exam:   VS:  BP 125/72   Pulse 72   Ht 5\' 1"  (1.549 m)   Wt 115 lb 12.8 oz (52.5 kg)   SpO2 91%   BMI 21.88 kg/m    Wt Readings from Last 3 Encounters:  07/08/20 115 lb 12.8 oz (52.5 kg)  07/30/19 109 lb 12.8 oz (49.8 kg)  10/28/17 113 lb (51.3 kg)    General: Well nourished, well developed, in no acute distress Head: Atraumatic, normal  size  Eyes: PEERLA, EOMI  Neck: Supple, no JVD Endocrine: No thryomegaly Cardiac: Normal S1, S2; RRR; no murmurs, rubs, or gallops Lungs: Clear to auscultation bilaterally, no wheezing, rhonchi or rales  Abd: Soft, nontender, no hepatomegaly  Ext: No edema, pulses 2+ Musculoskeletal: No deformities, BUE and BLE strength normal and equal Skin: Warm and dry, no rashes   Neuro: Alert and oriented to person, place, time, and situation, CNII-XII grossly intact, no focal deficits  Psych: Normal mood and affect   ASSESSMENT:   [name] is a 87 y.o. female who presents for the following: 1. Localized edema   2. Primary hypertension     PLAN:   1. Localized edema 2. Primary hypertension -Lower extremity edema limited to the ankles for nearly 1 year.  Has coincided with amlodipine use.  No evidence of edema today.  No evidence of congestive heart failure.  No murmurs.  EKG is normal.  Recent echocardiogram shows normal LV function.  No increased pressure in the heart.  She has mild tricuspid regurgitation which is likely normal.  She has no significant murmurs on examination.  Overall she appears to be in good health.  I suspect her lower extremity edema is related to amlodipine.  We have opted to decrease the dose to 2.5 mg daily.  She will continue with conservative measures such as leg elevation and reducing salt consumption.  Apparently she had difficult to control blood pressure prior to amlodipine.  She wishes to stay on it if possible.  She had a recent CT scan of her chest in 2018.  This showed no evidence of coronary calcium.  She has extremely high HDL cholesterol up to 96.  She is very low risk for heart disease development in the future.  I recommend no further cardiac testing at this time.  We will see how she does with the reduced dose of amlodipine.  She will see [place] yearly.   Disposition: Return in about 1 year (around 07/08/2021).  Medication Adjustments/Labs and Tests  Ordered: Current medicines are reviewed at length with the patient today.  Concerns regarding medicines are outlined above.  Orders Placed This Encounter  Procedures  . EKG 12-Lead   Meds ordered this encounter  Medications  . amLODipine (NORVASC) 2.5 MG tablet    Sig: Take 1 tablet (2.5 mg total) by mouth daily.    Dispense:  180 tablet    Refill:  3    Patient Instructions  Medication Instructions:  Decrease Amlodipine to 2.5 mg daily   *If you need a refill on your cardiac medications before your next appointment, please call your pharmacy*   Follow-Up: At [facility], you and your health needs are our priority.  As part of our continuing mission to provide you with exceptional heart care, we have created designated Provider Care Teams.  These Care Teams include your primary Cardiologist (physician) and Advanced Practice Providers (APPs -  Physician Assistants and Nurse Practitioners) who all work together to provide you with the care you need, when you need it.  We recommend signing up for the patient portal called "MyChart".  Sign up information is provided on this After Visit Summary.  MyChart is used to connect with patients for Virtual Visits (Telemedicine).  Patients are able to view lab/test results, encounter notes, upcoming appointments, etc.  Non-urgent messages can be sent to your provider as well.   To learn more about what you can do with MyChart, go to [email or web address].    Your next appointment:   12 month(s)  The format for your next appointment:   In Person  Provider:   Eleonore Chiquito, MD      Signed, Addison Naegeli. Audie Box, MD, [place]  [address] [place], [place] [address] [phone]  07/08/2020 10:45 AM

## 2020-07-08 ENCOUNTER — Ambulatory Visit: Payer: Medicare Other | Admitting: Cardiovascular Disease

## 2020-07-08 ENCOUNTER — Encounter: Payer: Self-pay | Admitting: Cardiovascular Disease

## 2020-07-08 ENCOUNTER — Other Ambulatory Visit: Payer: Self-pay

## 2020-07-08 VITALS — BP 125/72 | HR 72 | Ht 61.0 in | Wt 115.8 lb

## 2020-07-08 DIAGNOSIS — R600 Localized edema: Secondary | ICD-10-CM | POA: Diagnosis not present

## 2020-07-08 DIAGNOSIS — I10 Essential (primary) hypertension: Secondary | ICD-10-CM | POA: Diagnosis not present

## 2020-07-08 DIAGNOSIS — R0602 Shortness of breath: Secondary | ICD-10-CM

## 2020-07-08 MED ORDER — AMLODIPINE BESYLATE 2.5 MG PO TABS: 2.5000 mg | ORAL_TABLET | Freq: Every day | ORAL | 3 refills | Status: DC

## 2020-07-08 NOTE — Patient Instructions (Signed)
Medication Instructions:  Decrease Amlodipine to 2.5 mg daily   *If you need a refill on your cardiac medications before your next appointment, please call your pharmacy*   Follow-Up: At [facility], you and your health needs are our priority.  As part of our continuing mission to provide you with exceptional heart care, we have created designated Provider Care Teams.  These Care Teams include your primary Cardiologist (physician) and Advanced Practice Providers (APPs -  Physician Assistants and Nurse Practitioners) who all work together to provide you with the care you need, when you need it.  We recommend signing up for the patient portal called "MyChart".  Sign up information is provided on this After Visit Summary.  MyChart is used to connect with patients for Virtual Visits (Telemedicine).  Patients are able to view lab/test results, encounter notes, upcoming appointments, etc.  Non-urgent messages can be sent to your provider as well.   To learn more about what you can do with MyChart, go to [email or web address].    Your next appointment:   12 month(s)  The format for your next appointment:   In Person  Provider:   Eleonore Chiquito, MD

## 2020-07-28 DIAGNOSIS — H52203 Unspecified astigmatism, bilateral: Secondary | ICD-10-CM | POA: Diagnosis not present

## 2020-07-28 DIAGNOSIS — Z961 Presence of intraocular lens: Secondary | ICD-10-CM | POA: Diagnosis not present

## 2020-07-28 DIAGNOSIS — H43813 Vitreous degeneration, bilateral: Secondary | ICD-10-CM | POA: Diagnosis not present

## 2020-07-28 DIAGNOSIS — H04123 Dry eye syndrome of bilateral lacrimal glands: Secondary | ICD-10-CM | POA: Diagnosis not present

## 2020-08-23 DIAGNOSIS — I10 Essential (primary) hypertension: Secondary | ICD-10-CM | POA: Diagnosis not present

## 2020-08-23 DIAGNOSIS — Z96642 Presence of left artificial hip joint: Secondary | ICD-10-CM | POA: Diagnosis not present

## 2020-08-25 DIAGNOSIS — I10 Essential (primary) hypertension: Secondary | ICD-10-CM | POA: Diagnosis not present

## 2020-08-25 DIAGNOSIS — Z96642 Presence of left artificial hip joint: Secondary | ICD-10-CM | POA: Diagnosis not present

## 2020-08-28 DIAGNOSIS — I10 Essential (primary) hypertension: Secondary | ICD-10-CM | POA: Diagnosis not present

## 2020-08-28 DIAGNOSIS — Z96642 Presence of left artificial hip joint: Secondary | ICD-10-CM | POA: Diagnosis not present

## 2020-08-30 DIAGNOSIS — I10 Essential (primary) hypertension: Secondary | ICD-10-CM | POA: Diagnosis not present

## 2020-08-30 DIAGNOSIS — Z96642 Presence of left artificial hip joint: Secondary | ICD-10-CM | POA: Diagnosis not present

## 2020-09-06 DIAGNOSIS — Z96642 Presence of left artificial hip joint: Secondary | ICD-10-CM | POA: Diagnosis not present

## 2020-09-06 DIAGNOSIS — I10 Essential (primary) hypertension: Secondary | ICD-10-CM | POA: Diagnosis not present

## 2020-09-08 DIAGNOSIS — I10 Essential (primary) hypertension: Secondary | ICD-10-CM | POA: Diagnosis not present

## 2020-09-08 DIAGNOSIS — Z96642 Presence of left artificial hip joint: Secondary | ICD-10-CM | POA: Diagnosis not present

## 2020-09-13 DIAGNOSIS — Z96642 Presence of left artificial hip joint: Secondary | ICD-10-CM | POA: Diagnosis not present

## 2020-09-15 DIAGNOSIS — Z96642 Presence of left artificial hip joint: Secondary | ICD-10-CM | POA: Diagnosis not present

## 2020-09-20 DIAGNOSIS — Z96642 Presence of left artificial hip joint: Secondary | ICD-10-CM | POA: Diagnosis not present

## 2020-09-22 DIAGNOSIS — Z96642 Presence of left artificial hip joint: Secondary | ICD-10-CM | POA: Diagnosis not present

## 2020-09-28 DIAGNOSIS — Z96642 Presence of left artificial hip joint: Secondary | ICD-10-CM | POA: Diagnosis not present

## 2020-09-30 DIAGNOSIS — Z96642 Presence of left artificial hip joint: Secondary | ICD-10-CM | POA: Diagnosis not present

## 2020-10-04 DIAGNOSIS — Z96642 Presence of left artificial hip joint: Secondary | ICD-10-CM | POA: Diagnosis not present

## 2020-10-06 DIAGNOSIS — Z96642 Presence of left artificial hip joint: Secondary | ICD-10-CM | POA: Diagnosis not present

## 2020-10-11 DIAGNOSIS — Z96642 Presence of left artificial hip joint: Secondary | ICD-10-CM | POA: Diagnosis not present

## 2020-10-12 ENCOUNTER — Other Ambulatory Visit: Payer: Self-pay

## 2020-10-12 ENCOUNTER — Ambulatory Visit
Admission: RE | Admit: 2020-10-12 | Discharge: 2020-10-12 | Disposition: A | Discharge status: Home / Self Care | Payer: Medicare Other | Source: Ambulatory Visit | Attending: Family Medicine | Admitting: Family Medicine

## 2020-10-12 DIAGNOSIS — Z780 Asymptomatic menopausal state: Secondary | ICD-10-CM | POA: Diagnosis not present

## 2020-10-12 DIAGNOSIS — E2839 Other primary ovarian failure: Secondary | ICD-10-CM

## 2020-10-12 DIAGNOSIS — M810 Age-related osteoporosis without current pathological fracture: Secondary | ICD-10-CM | POA: Diagnosis not present

## 2020-10-18 DIAGNOSIS — Z96642 Presence of left artificial hip joint: Secondary | ICD-10-CM | POA: Diagnosis not present

## 2020-10-20 DIAGNOSIS — Z96642 Presence of left artificial hip joint: Secondary | ICD-10-CM | POA: Diagnosis not present

## 2020-10-25 DIAGNOSIS — Z96642 Presence of left artificial hip joint: Secondary | ICD-10-CM | POA: Diagnosis not present

## 2020-10-27 DIAGNOSIS — Z96642 Presence of left artificial hip joint: Secondary | ICD-10-CM | POA: Diagnosis not present

## 2020-11-01 DIAGNOSIS — Z96642 Presence of left artificial hip joint: Secondary | ICD-10-CM | POA: Diagnosis not present

## 2020-11-03 DIAGNOSIS — Z96642 Presence of left artificial hip joint: Secondary | ICD-10-CM | POA: Diagnosis not present

## 2020-11-08 DIAGNOSIS — Z96642 Presence of left artificial hip joint: Secondary | ICD-10-CM | POA: Diagnosis not present

## 2020-11-10 DIAGNOSIS — Z96642 Presence of left artificial hip joint: Secondary | ICD-10-CM | POA: Diagnosis not present

## 2020-11-15 DIAGNOSIS — Z96642 Presence of left artificial hip joint: Secondary | ICD-10-CM | POA: Diagnosis not present

## 2020-11-22 DIAGNOSIS — Z96642 Presence of left artificial hip joint: Secondary | ICD-10-CM | POA: Diagnosis not present

## 2020-11-29 DIAGNOSIS — Z96642 Presence of left artificial hip joint: Secondary | ICD-10-CM | POA: Diagnosis not present

## 2020-12-06 DIAGNOSIS — Z96642 Presence of left artificial hip joint: Secondary | ICD-10-CM | POA: Diagnosis not present

## 2020-12-13 DIAGNOSIS — Z96642 Presence of left artificial hip joint: Secondary | ICD-10-CM | POA: Diagnosis not present

## 2020-12-20 DIAGNOSIS — Z96642 Presence of left artificial hip joint: Secondary | ICD-10-CM | POA: Diagnosis not present

## 2020-12-21 DIAGNOSIS — N189 Chronic kidney disease, unspecified: Secondary | ICD-10-CM | POA: Diagnosis not present

## 2020-12-21 DIAGNOSIS — Q600 Renal agenesis, unilateral: Secondary | ICD-10-CM | POA: Diagnosis not present

## 2020-12-21 DIAGNOSIS — Z23 Encounter for immunization: Secondary | ICD-10-CM | POA: Diagnosis not present

## 2020-12-21 DIAGNOSIS — M810 Age-related osteoporosis without current pathological fracture: Secondary | ICD-10-CM | POA: Diagnosis not present

## 2020-12-27 DIAGNOSIS — Z96642 Presence of left artificial hip joint: Secondary | ICD-10-CM | POA: Diagnosis not present

## 2021-01-05 DIAGNOSIS — Z96642 Presence of left artificial hip joint: Secondary | ICD-10-CM | POA: Diagnosis not present

## 2021-01-10 DIAGNOSIS — Z96642 Presence of left artificial hip joint: Secondary | ICD-10-CM | POA: Diagnosis not present

## 2021-01-17 DIAGNOSIS — Z96642 Presence of left artificial hip joint: Secondary | ICD-10-CM | POA: Diagnosis not present

## 2021-01-24 DIAGNOSIS — Z96642 Presence of left artificial hip joint: Secondary | ICD-10-CM | POA: Diagnosis not present

## 2021-01-31 DIAGNOSIS — Z96642 Presence of left artificial hip joint: Secondary | ICD-10-CM | POA: Diagnosis not present

## 2021-02-07 DIAGNOSIS — Z96642 Presence of left artificial hip joint: Secondary | ICD-10-CM | POA: Diagnosis not present

## 2021-02-14 DIAGNOSIS — Z96642 Presence of left artificial hip joint: Secondary | ICD-10-CM | POA: Diagnosis not present

## 2021-02-21 DIAGNOSIS — Z96642 Presence of left artificial hip joint: Secondary | ICD-10-CM | POA: Diagnosis not present

## 2021-02-28 DIAGNOSIS — Z96642 Presence of left artificial hip joint: Secondary | ICD-10-CM | POA: Diagnosis not present

## 2021-03-07 DIAGNOSIS — Z96642 Presence of left artificial hip joint: Secondary | ICD-10-CM | POA: Diagnosis not present

## 2021-03-14 DIAGNOSIS — Z96642 Presence of left artificial hip joint: Secondary | ICD-10-CM | POA: Diagnosis not present

## 2021-03-21 DIAGNOSIS — Z96642 Presence of left artificial hip joint: Secondary | ICD-10-CM | POA: Diagnosis not present

## 2021-03-28 DIAGNOSIS — Z96642 Presence of left artificial hip joint: Secondary | ICD-10-CM | POA: Diagnosis not present

## 2021-04-04 DIAGNOSIS — Z96642 Presence of left artificial hip joint: Secondary | ICD-10-CM | POA: Diagnosis not present

## 2021-04-11 DIAGNOSIS — Z96642 Presence of left artificial hip joint: Secondary | ICD-10-CM | POA: Diagnosis not present

## 2021-05-02 DIAGNOSIS — Z96642 Presence of left artificial hip joint: Secondary | ICD-10-CM | POA: Diagnosis not present

## 2021-05-09 DIAGNOSIS — Z96642 Presence of left artificial hip joint: Secondary | ICD-10-CM | POA: Diagnosis not present

## 2021-05-16 DIAGNOSIS — Z96642 Presence of left artificial hip joint: Secondary | ICD-10-CM | POA: Diagnosis not present

## 2021-06-28 DIAGNOSIS — M7661 Achilles tendinitis, right leg: Secondary | ICD-10-CM | POA: Diagnosis not present

## 2021-06-28 DIAGNOSIS — M25571 Pain in right ankle and joints of right foot: Secondary | ICD-10-CM | POA: Diagnosis not present

## 2021-07-03 DIAGNOSIS — R296 Repeated falls: Secondary | ICD-10-CM | POA: Diagnosis not present

## 2021-07-03 DIAGNOSIS — R2689 Other abnormalities of gait and mobility: Secondary | ICD-10-CM | POA: Diagnosis not present

## 2021-07-04 DIAGNOSIS — R296 Repeated falls: Secondary | ICD-10-CM | POA: Diagnosis not present

## 2021-07-04 DIAGNOSIS — R2689 Other abnormalities of gait and mobility: Secondary | ICD-10-CM | POA: Diagnosis not present

## 2021-07-05 DIAGNOSIS — Z0000 Encounter for general adult medical examination without abnormal findings: Secondary | ICD-10-CM | POA: Diagnosis not present

## 2021-07-05 DIAGNOSIS — Z79899 Other long term (current) drug therapy: Secondary | ICD-10-CM | POA: Diagnosis not present

## 2021-07-05 DIAGNOSIS — Q600 Renal agenesis, unilateral: Secondary | ICD-10-CM | POA: Diagnosis not present

## 2021-07-05 DIAGNOSIS — M810 Age-related osteoporosis without current pathological fracture: Secondary | ICD-10-CM | POA: Diagnosis not present

## 2021-07-05 DIAGNOSIS — I10 Essential (primary) hypertension: Secondary | ICD-10-CM | POA: Diagnosis not present

## 2021-07-05 DIAGNOSIS — E538 Deficiency of other specified B group vitamins: Secondary | ICD-10-CM | POA: Diagnosis not present

## 2021-07-05 DIAGNOSIS — G4700 Insomnia, unspecified: Secondary | ICD-10-CM | POA: Diagnosis not present

## 2021-07-11 DIAGNOSIS — R296 Repeated falls: Secondary | ICD-10-CM | POA: Diagnosis not present

## 2021-07-11 DIAGNOSIS — R2689 Other abnormalities of gait and mobility: Secondary | ICD-10-CM | POA: Diagnosis not present

## 2021-07-13 DIAGNOSIS — R296 Repeated falls: Secondary | ICD-10-CM | POA: Diagnosis not present

## 2021-07-13 DIAGNOSIS — R2689 Other abnormalities of gait and mobility: Secondary | ICD-10-CM | POA: Diagnosis not present

## 2021-07-18 DIAGNOSIS — R2689 Other abnormalities of gait and mobility: Secondary | ICD-10-CM | POA: Diagnosis not present

## 2021-07-18 DIAGNOSIS — R296 Repeated falls: Secondary | ICD-10-CM | POA: Diagnosis not present

## 2021-07-20 DIAGNOSIS — R296 Repeated falls: Secondary | ICD-10-CM | POA: Diagnosis not present

## 2021-07-20 DIAGNOSIS — E871 Hypo-osmolality and hyponatremia: Secondary | ICD-10-CM | POA: Diagnosis not present

## 2021-07-20 DIAGNOSIS — R2689 Other abnormalities of gait and mobility: Secondary | ICD-10-CM | POA: Diagnosis not present

## 2021-07-25 DIAGNOSIS — R296 Repeated falls: Secondary | ICD-10-CM | POA: Diagnosis not present

## 2021-07-25 DIAGNOSIS — R2689 Other abnormalities of gait and mobility: Secondary | ICD-10-CM | POA: Diagnosis not present

## 2021-07-27 DIAGNOSIS — R2689 Other abnormalities of gait and mobility: Secondary | ICD-10-CM | POA: Diagnosis not present

## 2021-07-27 DIAGNOSIS — R296 Repeated falls: Secondary | ICD-10-CM | POA: Diagnosis not present

## 2021-08-01 DIAGNOSIS — H26493 Other secondary cataract, bilateral: Secondary | ICD-10-CM | POA: Diagnosis not present

## 2021-08-01 DIAGNOSIS — R2689 Other abnormalities of gait and mobility: Secondary | ICD-10-CM | POA: Diagnosis not present

## 2021-08-01 DIAGNOSIS — H04123 Dry eye syndrome of bilateral lacrimal glands: Secondary | ICD-10-CM | POA: Diagnosis not present

## 2021-08-01 DIAGNOSIS — R296 Repeated falls: Secondary | ICD-10-CM | POA: Diagnosis not present

## 2021-08-08 DIAGNOSIS — R296 Repeated falls: Secondary | ICD-10-CM | POA: Diagnosis not present

## 2021-08-08 DIAGNOSIS — R2689 Other abnormalities of gait and mobility: Secondary | ICD-10-CM | POA: Diagnosis not present

## 2021-08-10 DIAGNOSIS — R296 Repeated falls: Secondary | ICD-10-CM | POA: Diagnosis not present

## 2021-08-10 DIAGNOSIS — R2689 Other abnormalities of gait and mobility: Secondary | ICD-10-CM | POA: Diagnosis not present

## 2021-08-15 DIAGNOSIS — R296 Repeated falls: Secondary | ICD-10-CM | POA: Diagnosis not present

## 2021-08-15 DIAGNOSIS — R2689 Other abnormalities of gait and mobility: Secondary | ICD-10-CM | POA: Diagnosis not present

## 2021-08-17 DIAGNOSIS — R296 Repeated falls: Secondary | ICD-10-CM | POA: Diagnosis not present

## 2021-08-17 DIAGNOSIS — R2689 Other abnormalities of gait and mobility: Secondary | ICD-10-CM | POA: Diagnosis not present

## 2021-08-22 DIAGNOSIS — R2689 Other abnormalities of gait and mobility: Secondary | ICD-10-CM | POA: Diagnosis not present

## 2021-08-22 DIAGNOSIS — R296 Repeated falls: Secondary | ICD-10-CM | POA: Diagnosis not present

## 2021-08-24 DIAGNOSIS — R2689 Other abnormalities of gait and mobility: Secondary | ICD-10-CM | POA: Diagnosis not present

## 2021-08-24 DIAGNOSIS — R296 Repeated falls: Secondary | ICD-10-CM | POA: Diagnosis not present

## 2021-08-29 DIAGNOSIS — R2689 Other abnormalities of gait and mobility: Secondary | ICD-10-CM | POA: Diagnosis not present

## 2021-08-29 DIAGNOSIS — R296 Repeated falls: Secondary | ICD-10-CM | POA: Diagnosis not present

## 2021-08-30 DIAGNOSIS — M7661 Achilles tendinitis, right leg: Secondary | ICD-10-CM | POA: Diagnosis not present

## 2021-08-31 DIAGNOSIS — R296 Repeated falls: Secondary | ICD-10-CM | POA: Diagnosis not present

## 2021-08-31 DIAGNOSIS — R2689 Other abnormalities of gait and mobility: Secondary | ICD-10-CM | POA: Diagnosis not present

## 2021-09-29 DIAGNOSIS — M7661 Achilles tendinitis, right leg: Secondary | ICD-10-CM | POA: Diagnosis not present

## 2021-10-05 DIAGNOSIS — R296 Repeated falls: Secondary | ICD-10-CM | POA: Diagnosis not present

## 2021-10-05 DIAGNOSIS — R2689 Other abnormalities of gait and mobility: Secondary | ICD-10-CM | POA: Diagnosis not present

## 2021-10-06 ENCOUNTER — Other Ambulatory Visit: Payer: Self-pay | Admitting: Cardiovascular Disease

## 2021-10-12 DIAGNOSIS — R2689 Other abnormalities of gait and mobility: Secondary | ICD-10-CM | POA: Diagnosis not present

## 2021-10-12 DIAGNOSIS — R296 Repeated falls: Secondary | ICD-10-CM | POA: Diagnosis not present

## 2021-10-19 DIAGNOSIS — R2689 Other abnormalities of gait and mobility: Secondary | ICD-10-CM | POA: Diagnosis not present

## 2021-10-19 DIAGNOSIS — R296 Repeated falls: Secondary | ICD-10-CM | POA: Diagnosis not present

## 2021-10-26 DIAGNOSIS — R2689 Other abnormalities of gait and mobility: Secondary | ICD-10-CM | POA: Diagnosis not present

## 2021-10-26 DIAGNOSIS — R296 Repeated falls: Secondary | ICD-10-CM | POA: Diagnosis not present

## 2021-11-03 ENCOUNTER — Other Ambulatory Visit: Payer: Self-pay | Admitting: Cardiovascular Disease

## 2021-11-28 ENCOUNTER — Other Ambulatory Visit: Payer: Self-pay | Admitting: Cardiovascular Disease

## 2021-12-29 ENCOUNTER — Other Ambulatory Visit: Payer: Self-pay | Admitting: Cardiovascular Disease

## 2022-01-02 DIAGNOSIS — I10 Essential (primary) hypertension: Secondary | ICD-10-CM | POA: Diagnosis not present

## 2022-01-02 DIAGNOSIS — Z23 Encounter for immunization: Secondary | ICD-10-CM | POA: Diagnosis not present

## 2022-01-02 DIAGNOSIS — N189 Chronic kidney disease, unspecified: Secondary | ICD-10-CM | POA: Diagnosis not present

## 2022-01-11 ENCOUNTER — Other Ambulatory Visit: Payer: Self-pay | Admitting: Cardiovascular Disease

## 2022-01-12 NOTE — Telephone Encounter (Signed)
Refill request

## 2022-02-02 DIAGNOSIS — I10 Essential (primary) hypertension: Secondary | ICD-10-CM | POA: Diagnosis not present

## 2022-02-02 DIAGNOSIS — G4700 Insomnia, unspecified: Secondary | ICD-10-CM | POA: Diagnosis not present

## 2022-02-07 DIAGNOSIS — H16201 Unspecified keratoconjunctivitis, right eye: Secondary | ICD-10-CM | POA: Diagnosis not present

## 2022-03-16 DIAGNOSIS — M25552 Pain in left hip: Secondary | ICD-10-CM | POA: Diagnosis not present

## 2022-03-16 DIAGNOSIS — M25562 Pain in left knee: Secondary | ICD-10-CM | POA: Diagnosis not present

## 2022-03-27 DIAGNOSIS — R2689 Other abnormalities of gait and mobility: Secondary | ICD-10-CM | POA: Diagnosis not present

## 2022-03-27 DIAGNOSIS — R296 Repeated falls: Secondary | ICD-10-CM | POA: Diagnosis not present

## 2022-03-29 DIAGNOSIS — U071 COVID-19: Secondary | ICD-10-CM | POA: Diagnosis not present

## 2022-04-10 DIAGNOSIS — R2689 Other abnormalities of gait and mobility: Secondary | ICD-10-CM | POA: Diagnosis not present

## 2022-04-10 DIAGNOSIS — R296 Repeated falls: Secondary | ICD-10-CM | POA: Diagnosis not present

## 2022-04-12 DIAGNOSIS — R2689 Other abnormalities of gait and mobility: Secondary | ICD-10-CM | POA: Diagnosis not present

## 2022-04-12 DIAGNOSIS — R296 Repeated falls: Secondary | ICD-10-CM | POA: Diagnosis not present

## 2022-04-17 DIAGNOSIS — R2689 Other abnormalities of gait and mobility: Secondary | ICD-10-CM | POA: Diagnosis not present

## 2022-04-17 DIAGNOSIS — R296 Repeated falls: Secondary | ICD-10-CM | POA: Diagnosis not present

## 2022-04-19 DIAGNOSIS — R296 Repeated falls: Secondary | ICD-10-CM | POA: Diagnosis not present

## 2022-04-19 DIAGNOSIS — R2689 Other abnormalities of gait and mobility: Secondary | ICD-10-CM | POA: Diagnosis not present

## 2022-04-24 DIAGNOSIS — R2689 Other abnormalities of gait and mobility: Secondary | ICD-10-CM | POA: Diagnosis not present

## 2022-04-24 DIAGNOSIS — R296 Repeated falls: Secondary | ICD-10-CM | POA: Diagnosis not present

## 2022-04-26 DIAGNOSIS — R296 Repeated falls: Secondary | ICD-10-CM | POA: Diagnosis not present

## 2022-04-26 DIAGNOSIS — R2689 Other abnormalities of gait and mobility: Secondary | ICD-10-CM | POA: Diagnosis not present

## 2022-05-03 DIAGNOSIS — R296 Repeated falls: Secondary | ICD-10-CM | POA: Diagnosis not present

## 2022-05-03 DIAGNOSIS — R2689 Other abnormalities of gait and mobility: Secondary | ICD-10-CM | POA: Diagnosis not present

## 2022-05-08 DIAGNOSIS — R296 Repeated falls: Secondary | ICD-10-CM | POA: Diagnosis not present

## 2022-05-08 DIAGNOSIS — R2689 Other abnormalities of gait and mobility: Secondary | ICD-10-CM | POA: Diagnosis not present

## 2022-05-10 DIAGNOSIS — R2689 Other abnormalities of gait and mobility: Secondary | ICD-10-CM | POA: Diagnosis not present

## 2022-05-10 DIAGNOSIS — R296 Repeated falls: Secondary | ICD-10-CM | POA: Diagnosis not present

## 2022-05-15 DIAGNOSIS — R2689 Other abnormalities of gait and mobility: Secondary | ICD-10-CM | POA: Diagnosis not present

## 2022-05-15 DIAGNOSIS — R296 Repeated falls: Secondary | ICD-10-CM | POA: Diagnosis not present

## 2022-05-17 DIAGNOSIS — R296 Repeated falls: Secondary | ICD-10-CM | POA: Diagnosis not present

## 2022-05-17 DIAGNOSIS — R2689 Other abnormalities of gait and mobility: Secondary | ICD-10-CM | POA: Diagnosis not present

## 2022-05-22 DIAGNOSIS — R296 Repeated falls: Secondary | ICD-10-CM | POA: Diagnosis not present

## 2022-05-22 DIAGNOSIS — R2689 Other abnormalities of gait and mobility: Secondary | ICD-10-CM | POA: Diagnosis not present

## 2022-05-29 DIAGNOSIS — R2689 Other abnormalities of gait and mobility: Secondary | ICD-10-CM | POA: Diagnosis not present

## 2022-05-29 DIAGNOSIS — R296 Repeated falls: Secondary | ICD-10-CM | POA: Diagnosis not present

## 2022-05-31 DIAGNOSIS — R2689 Other abnormalities of gait and mobility: Secondary | ICD-10-CM | POA: Diagnosis not present

## 2022-05-31 DIAGNOSIS — R296 Repeated falls: Secondary | ICD-10-CM | POA: Diagnosis not present

## 2022-06-05 DIAGNOSIS — R296 Repeated falls: Secondary | ICD-10-CM | POA: Diagnosis not present

## 2022-06-05 DIAGNOSIS — R2689 Other abnormalities of gait and mobility: Secondary | ICD-10-CM | POA: Diagnosis not present

## 2022-06-12 DIAGNOSIS — R296 Repeated falls: Secondary | ICD-10-CM | POA: Diagnosis not present

## 2022-06-12 DIAGNOSIS — R2689 Other abnormalities of gait and mobility: Secondary | ICD-10-CM | POA: Diagnosis not present

## 2022-06-15 ENCOUNTER — Ambulatory Visit: Payer: Medicare Other | Admitting: Internal Medicine

## 2022-06-15 ENCOUNTER — Encounter: Payer: Self-pay | Admitting: Internal Medicine

## 2022-06-15 VITALS — BP 120/80 | HR 53 | Ht 60.0 in | Wt 111.0 lb

## 2022-06-15 DIAGNOSIS — R053 Chronic cough: Secondary | ICD-10-CM | POA: Diagnosis not present

## 2022-06-15 NOTE — Progress Notes (Signed)
HPI: 89 yo female never smoker followed for mixed COPD/Asthma , chronic cough and allergic rhinitis  TEST  06/06/15 Alpha-1 antitrypsin: MM (132)  CT CHEST W/O 05/16/16 :  No parenchymal nodule, mass, or opacity appreciated. No pleural effusion or thickening. No pericardial effusion. No pathologic mediastinal adenopathy.   06/19/16: FVC 2.13 L (95%) FEV1 1.54 L (93%) FEV1/FVC 0.72 FEF 25-75 0.97 L (87%) no bronchodilator response 09/12/15: FVC 2.13 L (94%) FEV1 1.54 L (92%) FEV1/FVC 0.72 FEF 25-75 1.01 L (80%) no bronchodilator response TLC 4.18 L (100%) RV 116% ERV 169% DLCO corrected 79% (hemoglobin 12.3) 07/21/12: FVC 2.25 L (89%) FEV1 1.45 L (78%) FEV1/FVC 0.64 FEF 25-75 0.77 L (53%)  Previous evaluation at [facility] voice center.     04/18/2017 Follow up : COPD and AR  Pt returns for a follow up . She has Mild COPD and was previously on Spriiva but has been off for a few months. Says over last month breathing has not been doing as well and cough has picked up . She says she has a chronic cough which is worse for last few week. All the rainy weather seems to make it worse.  Says that spiriva has been the best help in the past , seems to help her cough and breathing .  She denies chest pain, orthopnea, edema or fever.  No discolored mucus.    OV 10/28/2017  Chief Complaint  Patient presents with   Follow-up    copd; nodules;chronic cough worse with humidity;no SOB     Elana Alm , 89 y.o. , with dob 06/03/1930 and female ,Not Hispanic or Latino from [place] [address] - presents to lung clinic for based on chart review for history of COPD and chronic cough.   In review of the chart she's had normal DLCO hardly ever smoke normal forward function test and CT scan of the chest in 2018 without any description of emphysema. Therefore I'm not so sure she has COPD. Nevertheless she has Spiriva with her when she only uses as needed. She also tells the  medical assistant that she's had lung nodules but in review of the CT chest 2018 there are no lung nodules. He tells me that the main issue is that she's had recurrent chronic bronchitis which for many years Dr. Joya Gaskins took care of and resolved it.  Currently she says she feels pretty good. She's wondering about the need for regular follow-up. Her main issue that she is dealing with chronic back pain. Because she had a sharp level of COPD related to COPD with symptom score and is documented below but she says she is feeling really good and hardly having any symptoms. Walk test  - room air - 99% lowest 185 feet x 3 laps     OV 07/30/2019  Chief Complaint  Patient presents with   Follow-up    Pt last seen 10/28/17. Pt states she has been doing good since last visit and denies any complaints.     RAYAUNA PAIS , 89 y.o. , with dob Jul 01, 1930 and female ,Not Hispanic or Latino from [place] [address] - presents to lung clinic for based on chart review for history of COPD and chronic cough.   Patient is being treated for chronic bronchitis. She is out of her Spiriva and ask for refills. Denies any changes since her last office visit.  She has postnasal drip and is treating this with over-the-counter  medications. She ask about lung nodules on previous imaging and if she needs to have them followed up. I reviewed the imaging and previous findings. In 2008 lung nodules were noted and then followed up in 2011 CT chest.  At that time the nodule seen stable and further follow-up was not recommended.  Patient had imaging with CT Chest WO contrast in 2018 and the nodules were not seen.    OV 06/15/2022  Subjective:  Patient ID: Elana Alm, female , DOB: May 05, 1930 , age 89 y.o. , MRN: [id number] , ADDRESS: [address] [place] [address] PCP Saintclair Halsted, FNP Patient Care Team: Saintclair Halsted, FNP as PCP - General (Family Medicine) Elsie Stain, MD as Attending Physician (Pulmonary Disease)  This Provider for this visit: Treatment Team:  Attending Provider: Brand Males, MD    06/15/2022 -   Chief Complaint  Patient presents with   Follow-up    Cough f/up     HPI SHENIQUE HARJU 89 y.o. -returns for follow-up.  I personally not seen her in a few years.  She is going to be 89 years old  this EASTER She is now moved into a retirement community called [facility] by [employer] to have 1.  She is quite ambulating.  She is driving because she uses a cane.  She says she has lost some height but is otherwise quite active.  Her mild chronic cough with postnasal drip and clearing of the throat continues.  She is not taking Spiriva inhaler.  Her last imaging of CT scan of the chest was in 2018.  Because of the ongoing chronic cough she is willing to get this reevaluated.  There are no other new issues.      CAT COPD Symptom & Quality of Life Score (GSK trademark) 0 is no burden. 5 is highest burden 07/30/2019   Never Cough -> Cough all the time 2  No phlegm in chest -> Chest is full of phlegm 1  No chest tightness -> Chest feels very tight 0  No dyspnea for 1 flight stairs/hill -> Very dyspneic for 1 flight of stairs 3  No limitations for ADL at home -> Very limited with ADL at home 1  Confident leaving home -> Not at all confident leaving home 0  Sleep soundly -> Do not sleep soundly because of lung condition 2  Lots of Energy -> No energy at all 2  TOTAL Score (max 40)  11     PFT     Latest Ref Rng & Units 06/19/2016    3:06 PM 09/12/2015    8:54 AM  PFT Results  FVC-Pre L 2.13  2.13   FVC-Predicted Pre % 95  94   FVC-Post L 2.09  2.07   FVC-Predicted Post % 94  91   Pre FEV1/FVC % % 72  72   Post FEV1/FCV % % 75  76   FEV1-Pre L 1.54  1.54   FEV1-Predicted Pre % 93  92   FEV1-Post L 1.57  1.57   DLCO uncorrected ml/min/mmHg  16.69   DLCO UNC% %  76   DLCO corrected ml/min/mmHg  17.31   DLCO COR  %Predicted %  79   DLVA Predicted %  98   TLC L  4.82   TLC % Predicted %  100   RV % Predicted %  116        has a past medical history of Arthritis, BOOP (  bronchiolitis obliterans with organizing pneumonia) ([place]), Bronchitis, Chronic kidney disease, Claustrophobia, Dysrhythmia, Hypertension, PONV (postoperative nausea and vomiting), and Pulmonary nodules.   reports that she has never smoked. She has been exposed to tobacco smoke. She has never used smokeless tobacco.  Past Surgical History:  Procedure Laterality Date   APPENDECTOMY     cataract surgery      bilateral    CHOLECYSTECTOMY     CYSTOSCOPY WITH RETROGRADE PYELOGRAM, URETEROSCOPY AND STENT PLACEMENT Right 08/20/2014   Procedure: CYSTOSCOPY WITH RIGHT RETROGRADE PYELOGRAM, RIGHT DIAGNOSTIC URETEROSCOPY, RIGHT URETERAL STENT PLACEMENT;  Surgeon: Alexis Frock, MD;  Location: [facility];  Service: Urology;  Laterality: Right;   HIP SURGERY     partial replacement of left hip    meniscus tear surgery in right knee      ROBOT ASSISTED LAPAROSCOPIC NEPHRECTOMY Right 10/01/2014   Procedure: ROBOTIC ASSISTED LAPAROSCOPIC NEPHRECTOMY LYSIS OF ADHESIONS;  Surgeon: Alexis Frock, MD;  Location: [facility];  Service: Urology;  Laterality: Right;   TONSILLECTOMY      Allergies  Allergen Reactions   Tramadol Hcl Nausea And Vomiting   Tramadol Hcl Nausea And Vomiting   Doxycycline Other (See Comments)    Other reaction(s): GI Upset (intolerance) Upsets stomach and makes feel uncomfortable Upsets stomach and makes feel uncomfortable   Codeine Anxiety    Other reaction(s): Other (See Comments) Hallucinations  Hallucinations     Immunization History  Administered Date(s) Administered   Influenza Split 12/25/2010, 12/04/2011, 01/02/2013, 12/24/2016   Influenza Whole 12/28/2005, 12/24/2008   Influenza, High Dose Seasonal PF 12/31/2016, 11/25/2018   Influenza,inj,Quad PF,6+ Mos 01/04/2014, 12/08/2014   Influenza-Unspecified 11/25/2015    Pneumococcal Conjugate-13 02/02/2013   Pneumococcal Polysaccharide-23 06/06/2015    Family History  Problem Relation Age of Onset   Hypertension Mother    Cancer Mother    Heart failure Father    Emphysema Father    Prostate cancer Brother    CAD Brother        CABG     Current Outpatient Medications:    acetaminophen (TYLENOL) 325 MG tablet, Take 325 mg by mouth every 6 (six) hours as needed., Disp: , Rfl:    amLODipine (NORVASC) 2.5 MG tablet, Take 1 tablet (2.5 mg total) by mouth daily. Please schedule office visit for futher refills 2nd attempt (Patient taking differently: Take 5 mg by mouth daily. Pt taking 5mg  daily), Disp: 15 tablet, Rfl: 0   Cholecalciferol (VITAMIN D-3) 1000 UNITS CAPS, Take 1 capsule by mouth daily., Disp: , Rfl:    fluticasone (FLONASE) 50 MCG/ACT nasal spray, Place 2 sprays into the nose daily as needed for allergies or rhinitis. , Disp: , Rfl:    gabapentin (NEURONTIN) 100 MG capsule, Take 100 mg by mouth 3 (three) times daily as needed., Disp: , Rfl:    loratadine (CLARITIN) 10 MG tablet, Take 10 mg by mouth daily as needed for allergies., Disp: , Rfl:    LORazepam (ATIVAN) 0.5 MG tablet, Take 0.5 mg by mouth at bedtime. , Disp: , Rfl:    Multiple Vitamin (MULTIVITAMIN) tablet, Take 1 tablet by mouth daily as needed. , Disp: , Rfl:    naphazoline-glycerin (CLEAR EYES) 0.012-0.2 % SOLN, Place 1-2 drops into both eyes every 4 (four) hours as needed for irritation., Disp: , Rfl:    olmesartan (BENICAR) 5 MG tablet, Take 5-10 mg by mouth every morning. Pt takes 2 tablets a day, Disp: , Rfl:    Psyllium 28.3 % POWD, Take by mouth  as needed., Disp: , Rfl:    vitamin B-12 (CYANOCOBALAMIN) 1000 MCG tablet, Take 1,000 mcg by mouth daily as needed. , Disp: , Rfl:    Probiotic Product (PROBIOTIC DAILY PO), Take by mouth daily. (Patient not taking: Reported on 06/15/2022), Disp: , Rfl:       Objective:   Vitals:   06/15/22 0957  BP: 120/80  Pulse: (!) 53   SpO2: 100%  Weight: 111 lb (50.3 kg)  Height: 5' (1.524 m)    Estimated body mass index is 21.68 kg/m as calculated from the following:   Height as of this encounter: 5' (1.524 m).   Weight as of this encounter: 111 lb (50.3 kg).  @WEIGHTCHANGE @  [employer]   06/15/22 0957  Weight: 111 lb (50.3 kg)     Physical Exam    General: No distress. Looks well Neuro: Alert and Oriented x 3. GCS 15. Speech normal Psych: Pleasant Resp:  Barrel Chest - no.  Wheeze - no, Crackles - no, No overt respiratory distress CVS: Normal heart sounds. Murmurs - no Ext: Stigmata of Connective Tissue Disease - no HEENT: Normal upper airway. PEERL +. No post nasal drip        Assessment:       ICD-10-CM   1. Chronic cough  R05.3          Plan:     Patient Instructions     ICD-10-CM   1. Cough  R05   2. Post-nasal drip  R09.82     -Your current cough is mild and probably mostly due to postnasal drip.  There is no evidence of COPD on a breathing test of her smoking history or CT scan of the chest 2018  -There is no lung nodule mentioned in 2018   -  Plan -Stop Spiriva [no need to restart] -Continue nasal steroid -get HRCT chest due to chronic cough  and no CT since 2018 - next few weeks - Happy 89th birthday  Follow-up - will call with CT results -Regular office visit in 6 months with Dr. Gaye Alken    Dr. Brand Males, M.D., F.C.C.P,  Pulmonary and Critical Care Medicine Staff Physician, [place] Director - Interstitial Lung Disease  Program  Pulmonary [place] at [place], [place], [address]  Pager: [phone], If no answer or between  15:00h - 7:00h: call [phone] Telephone: [phone]  10:36 AM 06/15/2022

## 2022-06-15 NOTE — Patient Instructions (Addendum)
ICD-10-CM   1. Cough  R05   2. Post-nasal drip  R09.82     -Your current cough is mild and probably mostly due to postnasal drip.  There is no evidence of COPD on a breathing test of her smoking history or CT scan of the chest 2018  -There is no lung nodule mentioned in 2018   -  Plan -Continue nasal steroid -get HRCT chest due to chronic cough  and no CT since 2018 - next few weeks - Happy 89th birthday  Follow-up - will call with CT results -Regular office visit in 6 months with Dr. Chase Caller

## 2022-06-19 DIAGNOSIS — R2689 Other abnormalities of gait and mobility: Secondary | ICD-10-CM | POA: Diagnosis not present

## 2022-06-19 DIAGNOSIS — R296 Repeated falls: Secondary | ICD-10-CM | POA: Diagnosis not present

## 2022-06-26 DIAGNOSIS — R2689 Other abnormalities of gait and mobility: Secondary | ICD-10-CM | POA: Diagnosis not present

## 2022-06-26 DIAGNOSIS — R296 Repeated falls: Secondary | ICD-10-CM | POA: Diagnosis not present

## 2022-07-03 DIAGNOSIS — R296 Repeated falls: Secondary | ICD-10-CM | POA: Diagnosis not present

## 2022-07-03 DIAGNOSIS — R2689 Other abnormalities of gait and mobility: Secondary | ICD-10-CM | POA: Diagnosis not present

## 2022-07-05 ENCOUNTER — Ambulatory Visit (HOSPITAL_COMMUNITY)
Admission: RE | Admit: 2022-07-05 | Discharge: 2022-07-05 | Disposition: A | Discharge status: Home / Self Care | Payer: Medicare Other | Source: Ambulatory Visit | Attending: Internal Medicine | Admitting: Internal Medicine

## 2022-07-05 DIAGNOSIS — R053 Chronic cough: Secondary | ICD-10-CM | POA: Diagnosis not present

## 2022-07-05 DIAGNOSIS — R918 Other nonspecific abnormal finding of lung field: Secondary | ICD-10-CM | POA: Diagnosis not present

## 2022-07-10 DIAGNOSIS — R2689 Other abnormalities of gait and mobility: Secondary | ICD-10-CM | POA: Diagnosis not present

## 2022-07-10 DIAGNOSIS — R296 Repeated falls: Secondary | ICD-10-CM | POA: Diagnosis not present

## 2022-07-11 DIAGNOSIS — Z23 Encounter for immunization: Secondary | ICD-10-CM | POA: Diagnosis not present

## 2022-07-11 DIAGNOSIS — I700 Atherosclerosis of aorta: Secondary | ICD-10-CM | POA: Diagnosis not present

## 2022-07-11 DIAGNOSIS — E785 Hyperlipidemia, unspecified: Secondary | ICD-10-CM | POA: Diagnosis not present

## 2022-07-11 DIAGNOSIS — I2510 Atherosclerotic heart disease of native coronary artery without angina pectoris: Secondary | ICD-10-CM | POA: Diagnosis not present

## 2022-07-11 DIAGNOSIS — M810 Age-related osteoporosis without current pathological fracture: Secondary | ICD-10-CM | POA: Diagnosis not present

## 2022-07-11 DIAGNOSIS — Z0000 Encounter for general adult medical examination without abnormal findings: Secondary | ICD-10-CM | POA: Diagnosis not present

## 2022-07-11 DIAGNOSIS — J449 Chronic obstructive pulmonary disease, unspecified: Secondary | ICD-10-CM | POA: Diagnosis not present

## 2022-07-11 DIAGNOSIS — G4700 Insomnia, unspecified: Secondary | ICD-10-CM | POA: Diagnosis not present

## 2022-07-11 DIAGNOSIS — N1831 Chronic kidney disease, stage 3a: Secondary | ICD-10-CM | POA: Diagnosis not present

## 2022-07-11 DIAGNOSIS — I10 Essential (primary) hypertension: Secondary | ICD-10-CM | POA: Diagnosis not present

## 2022-07-11 DIAGNOSIS — E559 Vitamin D deficiency, unspecified: Secondary | ICD-10-CM | POA: Diagnosis not present

## 2022-07-11 DIAGNOSIS — I2584 Coronary atherosclerosis due to calcified coronary lesion: Secondary | ICD-10-CM | POA: Diagnosis not present

## 2022-07-16 DIAGNOSIS — J069 Acute upper respiratory infection, unspecified: Secondary | ICD-10-CM | POA: Diagnosis not present

## 2022-07-16 DIAGNOSIS — R051 Acute cough: Secondary | ICD-10-CM | POA: Diagnosis not present

## 2022-07-16 DIAGNOSIS — Z03818 Encounter for observation for suspected exposure to other biological agents ruled out: Secondary | ICD-10-CM | POA: Diagnosis not present

## 2022-07-17 DIAGNOSIS — R296 Repeated falls: Secondary | ICD-10-CM | POA: Diagnosis not present

## 2022-07-17 DIAGNOSIS — R2689 Other abnormalities of gait and mobility: Secondary | ICD-10-CM | POA: Diagnosis not present

## 2022-07-24 DIAGNOSIS — R2689 Other abnormalities of gait and mobility: Secondary | ICD-10-CM | POA: Diagnosis not present

## 2022-07-24 DIAGNOSIS — R296 Repeated falls: Secondary | ICD-10-CM | POA: Diagnosis not present

## 2022-07-30 DIAGNOSIS — H6123 Impacted cerumen, bilateral: Secondary | ICD-10-CM | POA: Diagnosis not present

## 2022-07-30 DIAGNOSIS — J22 Unspecified acute lower respiratory infection: Secondary | ICD-10-CM | POA: Diagnosis not present

## 2022-07-30 DIAGNOSIS — Z9989 Dependence on other enabling machines and devices: Secondary | ICD-10-CM | POA: Diagnosis not present

## 2022-07-31 DIAGNOSIS — R2689 Other abnormalities of gait and mobility: Secondary | ICD-10-CM | POA: Diagnosis not present

## 2022-07-31 DIAGNOSIS — R296 Repeated falls: Secondary | ICD-10-CM | POA: Diagnosis not present

## 2022-08-07 DIAGNOSIS — R296 Repeated falls: Secondary | ICD-10-CM | POA: Diagnosis not present

## 2022-08-07 DIAGNOSIS — R2689 Other abnormalities of gait and mobility: Secondary | ICD-10-CM | POA: Diagnosis not present

## 2022-08-10 DIAGNOSIS — Z961 Presence of intraocular lens: Secondary | ICD-10-CM | POA: Diagnosis not present

## 2022-08-10 DIAGNOSIS — H26492 Other secondary cataract, left eye: Secondary | ICD-10-CM | POA: Diagnosis not present

## 2022-08-14 DIAGNOSIS — R2689 Other abnormalities of gait and mobility: Secondary | ICD-10-CM | POA: Diagnosis not present

## 2022-08-14 DIAGNOSIS — R296 Repeated falls: Secondary | ICD-10-CM | POA: Diagnosis not present

## 2022-08-20 NOTE — Progress Notes (Unsigned)
Cardiology Office Note:   Date:  08/22/2022  NAME:  [name]    MRN: [id number] DOB:  07-Jan-1931   PCP:  Camie Patience, FNP  Cardiologist:  None  Electrophysiologist:  None   Referring MD: Camie Patience, FNP   Chief Complaint  Patient presents with   Follow-up         History of Present Illness:   [name] is a 89 y.o. female with a hx of COPD who presents for follow-up. Recent chest CT with aortic atherosclerosis.  She presents for follow-up.  Recent CT chest shows mild coronary calcium aortic calcifications.  Reports no chest pains or trouble breathing.  Blood pressure is well-controlled.  Most recent lipids seem to be acceptable given her age.  She has high HDL cholesterol.  We will continue this.  CV exam unchanged and normal.  EKG is normal in office today.  Overall seems to be doing well without any complaints.  We discussed that her age this does not need further treatment or evaluation especially in the absence of symptoms.  [name] -> Independent Living   Problem List 1. COPD 2. HTN 3.  Coronary calcifications on chest CT -T chol 170, HDL 86, LDL 70, TG 67  Past Medical History: Past Medical History:  Diagnosis Date   Arthritis    BOOP (bronchiolitis obliterans with organizing pneumonia) (HCC)    Bronchitis    hx of    Chronic kidney disease    Claustrophobia    Dysrhythmia    Hypertension    PONV (postoperative nausea and vomiting)    Pulmonary nodules     Past Surgical History: Past Surgical History:  Procedure Laterality Date   APPENDECTOMY     cataract surgery      bilateral    CHOLECYSTECTOMY     CYSTOSCOPY WITH RETROGRADE PYELOGRAM, URETEROSCOPY AND STENT PLACEMENT Right 08/20/2014   Procedure: CYSTOSCOPY WITH RIGHT RETROGRADE PYELOGRAM, RIGHT DIAGNOSTIC URETEROSCOPY, RIGHT URETERAL STENT PLACEMENT;  Surgeon: Sebastian Ache, MD;  [facility];  Service: Urology;  Laterality: Right;   HIP SURGERY      partial replacement of left hip    meniscus tear surgery in right knee      ROBOT ASSISTED LAPAROSCOPIC NEPHRECTOMY Right 10/01/2014   Procedure: ROBOTIC ASSISTED LAPAROSCOPIC NEPHRECTOMY LYSIS OF ADHESIONS;  Surgeon: Sebastian Ache, MD;  Location: [facility];  Service: Urology;  Laterality: Right;   TONSILLECTOMY      Current Medications: Current Meds  Medication Sig   acetaminophen (TYLENOL) 325 MG tablet Take 325 mg by mouth every 6 (six) hours as needed.   amLODipine (NORVASC) 2.5 MG tablet Take 1 tablet (2.5 mg total) by mouth daily. Please schedule office visit for futher refills 2nd attempt (Patient taking differently: Take 5 mg by mouth daily. Pt taking 5mg  daily)   Cholecalciferol (VITAMIN D-3) 1000 UNITS CAPS Take 1 capsule by mouth daily.   fluticasone (FLONASE) 50 MCG/ACT nasal spray Place 2 sprays into the nose daily as needed for allergies or rhinitis.    gabapentin (NEURONTIN) 100 MG capsule Take 200 mg by mouth 3 (three) times daily as needed.   loratadine (CLARITIN) 10 MG tablet Take 10 mg by mouth daily as needed for allergies.   LORazepam (ATIVAN) 0.5 MG tablet Take 0.5 mg by mouth at bedtime.    Multiple Vitamin (MULTIVITAMIN) tablet Take 1 tablet by mouth every other day.   naphazoline-glycerin (CLEAR EYES) 0.012-0.2 % SOLN Place 1-2 drops into  both eyes every 4 (four) hours as needed for irritation.   olmesartan (BENICAR) 5 MG tablet Take 5 mg by mouth every morning.   Probiotic Product (PROBIOTIC DAILY PO) Take by mouth daily.   Psyllium 28.3 % POWD Take by mouth as needed.   vitamin B-12 (CYANOCOBALAMIN) 1000 MCG tablet Take 1,000 mcg by mouth every other day.     Allergies:    Tramadol hcl, Tramadol hcl, Doxycycline, and Codeine   Social History: Social History   Socioeconomic History   Marital status: Married    Spouse name: Not on file   Number of children: Not on file   Years of education: Not on file   Highest education level: Not on file  Occupational  History   Not on file  Tobacco Use   Smoking status: Never    Passive exposure: Yes   Smokeless tobacco: Never   Tobacco comments:    From father  Substance and Sexual Activity   Alcohol use: Yes    Alcohol/week: 0.0 standard drinks of alcohol    Comment: occasional glass of wine    Drug use: No   Sexual activity: Not on file  Other Topics Concern   Not on file  Social History Narrative   Patient is originally from [facility]. She has always lived in [place]. She has prior travel to [place], [place], [place], & [place]. Previously worked as a Engineer, site. No pets currently. Remote canary exposure as a child. No mold exposure. No asbestos exposure.    Social Determinants of Health   Financial Resource Strain: Not on file  Food Insecurity: Not on file  Transportation Needs: Not on file  Physical Activity: Not on file  Stress: Not on file  Social Connections: Not on file     Family History: The patient's family history includes CAD in her brother; Cancer in her mother; Emphysema in her father; Heart failure in her father; Hypertension in her mother; Prostate cancer in her brother.  ROS:   All other ROS reviewed and negative. Pertinent positives noted in the HPI.     EKGs/Labs/Other Studies Reviewed:   The following studies were personally reviewed by me today:  EKG:  EKG is ordered today.  The ekg ordered today demonstrates normal sinus rhythm heart 74, no acute ischemic changes or evidence of infarction, and was personally reviewed by me.   Recent Labs: No results found for requested labs within last 365 days.   Recent Lipid Panel No results found for: "CHOL", "TRIG", "HDL", "CHOLHDL", "VLDL", "LDLCALC", "LDLDIRECT"  Physical Exam:   VS:  BP 124/82   Pulse 74   Ht 5' (1.524 m)   Wt 113 lb 3.2 oz (51.3 kg)   SpO2 95%   BMI 22.11 kg/m    Wt Readings from Last 3 Encounters:  08/22/22 113 lb 3.2 oz (51.3 kg)  06/15/22 111 lb (50.3 kg)  07/08/20 115 lb 12.8 oz (52.5 kg)     General: Well nourished, well developed, in no acute distress Head: Atraumatic, normal size  Eyes: PEERLA, EOMI  Neck: Supple, no JVD Endocrine: No thryomegaly Cardiac: Normal S1, S2; RRR; no murmurs, rubs, or gallops Lungs: Clear to auscultation bilaterally, no wheezing, rhonchi or rales  Abd: Soft, nontender, no hepatomegaly  Ext: No edema, pulses 2+ Musculoskeletal: No deformities, BUE and BLE strength normal and equal Skin: Warm and dry, no rashes   Neuro: Alert and oriented to person, place, time, and situation, CNII-XII grossly intact, no focal deficits  Psych:  Normal mood and affect   ASSESSMENT:   KESHAUNA VICENCIO is a 89 y.o. female who presents for the following: 1. Aortic atherosclerosis (HCC)   2. Coronary artery calcification seen on computed tomography     PLAN:   1. Aortic atherosclerosis (HCC) 2. Coronary artery calcification seen on computed tomography -Mild calcification seen on recent chest CT.  No signs of angina.  Normal EKG.  LDL cholesterol is close enough to goal.  Given her age I really would not recommend any treatment at this time.  She has high HDL cholesterol which is protective.  She is active.  She can see [place] yearly to discuss further but at this time would recommend against any statin agents.  Also unclear how much of a benefit she would get from aspirin at age 89.  I think for now we can just monitor this conservatively.      Disposition: Return in about 1 year (around 08/22/2023).  Medication Adjustments/Labs and Tests Ordered: Current medicines are reviewed at length with the patient today.  Concerns regarding medicines are outlined above.  Orders Placed This Encounter  Procedures   EKG 12-Lead   No orders of the defined types were placed in this encounter.   Patient Instructions  Medication Instructions:  The current medical regimen is effective;  continue present plan and medications.  *If you need a refill on your cardiac  medications before your next appointment, please call your pharmacy*   Follow-Up: At [facility], you and your health needs are our priority.  As part of our continuing mission to provide you with exceptional heart care, we have created designated Provider Care Teams.  These Care Teams include your primary Cardiologist (physician) and Advanced Practice Providers (APPs -  Physician Assistants and Nurse Practitioners) who all work together to provide you with the care you need, when you need it.  We recommend signing up for the patient portal called "MyChart".  Sign up information is provided on this After Visit Summary.  MyChart is used to connect with patients for Virtual Visits (Telemedicine).  Patients are able to view lab/test results, encounter notes, upcoming appointments, etc.  Non-urgent messages can be sent to your provider as well.   To learn more about what you can do with MyChart, go to [email or web address].    Your next appointment:   12 month(s)  Provider:   Lennie Odor, MD    Time Spent with Patient: I have spent a total of 25 minutes with patient reviewing hospital notes, telemetry, EKGs, labs and examining the patient as well as establishing an assessment and plan that was discussed with the patient.  > 50% of time was spent in direct patient care.  Signed, Lenna Gilford. Flora Lipps, MD, [facility]  [facility]  [address], [address] [place], [place] [address] [phone]  08/22/2022 12:18 PM

## 2022-08-21 DIAGNOSIS — R2689 Other abnormalities of gait and mobility: Secondary | ICD-10-CM | POA: Diagnosis not present

## 2022-08-21 DIAGNOSIS — R296 Repeated falls: Secondary | ICD-10-CM | POA: Diagnosis not present

## 2022-08-22 ENCOUNTER — Ambulatory Visit: Payer: Medicare Other | Attending: Cardiovascular Disease | Admitting: Cardiovascular Disease

## 2022-08-22 ENCOUNTER — Encounter: Payer: Self-pay | Admitting: Cardiovascular Disease

## 2022-08-22 VITALS — BP 124/82 | HR 74 | Ht 60.0 in | Wt 113.2 lb

## 2022-08-22 DIAGNOSIS — I700 Atherosclerosis of aorta: Secondary | ICD-10-CM | POA: Diagnosis not present

## 2022-08-22 DIAGNOSIS — I2510 Atherosclerotic heart disease of native coronary artery without angina pectoris: Secondary | ICD-10-CM

## 2022-08-22 NOTE — Patient Instructions (Signed)
Medication Instructions:  The current medical regimen is effective;  continue present plan and medications.  *If you need a refill on your cardiac medications before your next appointment, please call your pharmacy*   Follow-Up: At [facility], you and your health needs are our priority.  As part of our continuing mission to provide you with exceptional heart care, we have created designated Provider Care Teams.  These Care Teams include your primary Cardiologist (physician) and Advanced Practice Providers (APPs -  Physician Assistants and Nurse Practitioners) who all work together to provide you with the care you need, when you need it.  We recommend signing up for the patient portal called "MyChart".  Sign up information is provided on this After Visit Summary.  MyChart is used to connect with patients for Virtual Visits (Telemedicine).  Patients are able to view lab/test results, encounter notes, upcoming appointments, etc.  Non-urgent messages can be sent to your provider as well.   To learn more about what you can do with MyChart, go to [email or web address].    Your next appointment:   12 month(s)  Provider:   Bronson O'Neal, MD   

## 2022-08-28 DIAGNOSIS — R2689 Other abnormalities of gait and mobility: Secondary | ICD-10-CM | POA: Diagnosis not present

## 2022-08-28 DIAGNOSIS — R296 Repeated falls: Secondary | ICD-10-CM | POA: Diagnosis not present

## 2022-09-04 DIAGNOSIS — R2689 Other abnormalities of gait and mobility: Secondary | ICD-10-CM | POA: Diagnosis not present

## 2022-09-04 DIAGNOSIS — R296 Repeated falls: Secondary | ICD-10-CM | POA: Diagnosis not present

## 2022-09-11 DIAGNOSIS — R296 Repeated falls: Secondary | ICD-10-CM | POA: Diagnosis not present

## 2022-09-11 DIAGNOSIS — R2689 Other abnormalities of gait and mobility: Secondary | ICD-10-CM | POA: Diagnosis not present

## 2022-09-18 DIAGNOSIS — R2689 Other abnormalities of gait and mobility: Secondary | ICD-10-CM | POA: Diagnosis not present

## 2022-09-18 DIAGNOSIS — R296 Repeated falls: Secondary | ICD-10-CM | POA: Diagnosis not present

## 2022-09-26 DIAGNOSIS — M545 Low back pain, unspecified: Secondary | ICD-10-CM | POA: Diagnosis not present

## 2022-09-26 DIAGNOSIS — Z9989 Dependence on other enabling machines and devices: Secondary | ICD-10-CM | POA: Diagnosis not present

## 2022-09-26 DIAGNOSIS — R21 Rash and other nonspecific skin eruption: Secondary | ICD-10-CM | POA: Diagnosis not present

## 2022-11-13 DIAGNOSIS — D3612 Benign neoplasm of peripheral nerves and autonomic nervous system, upper limb, including shoulder: Secondary | ICD-10-CM | POA: Diagnosis not present

## 2022-11-13 DIAGNOSIS — L570 Actinic keratosis: Secondary | ICD-10-CM | POA: Diagnosis not present

## 2022-11-13 DIAGNOSIS — D1801 Hemangioma of skin and subcutaneous tissue: Secondary | ICD-10-CM | POA: Diagnosis not present

## 2022-11-13 DIAGNOSIS — D225 Melanocytic nevi of trunk: Secondary | ICD-10-CM | POA: Diagnosis not present

## 2022-11-13 DIAGNOSIS — L821 Other seborrheic keratosis: Secondary | ICD-10-CM | POA: Diagnosis not present

## 2022-12-29 ENCOUNTER — Emergency Department (HOSPITAL_COMMUNITY): Payer: Medicare Other

## 2022-12-29 ENCOUNTER — Emergency Department (HOSPITAL_COMMUNITY)
Admission: EM | Admit: 2022-12-29 | Discharge: 2022-12-29 | Disposition: A | Discharge status: Home / Self Care | Payer: Medicare Other | Attending: Emergency Medicine | Admitting: Emergency Medicine

## 2022-12-29 DIAGNOSIS — G319 Degenerative disease of nervous system, unspecified: Secondary | ICD-10-CM | POA: Diagnosis not present

## 2022-12-29 DIAGNOSIS — I6782 Cerebral ischemia: Secondary | ICD-10-CM | POA: Diagnosis not present

## 2022-12-29 DIAGNOSIS — Z743 Need for continuous supervision: Secondary | ICD-10-CM | POA: Diagnosis not present

## 2022-12-29 DIAGNOSIS — W19XXXA Unspecified fall, initial encounter: Secondary | ICD-10-CM | POA: Diagnosis not present

## 2022-12-29 DIAGNOSIS — Z471 Aftercare following joint replacement surgery: Secondary | ICD-10-CM | POA: Diagnosis not present

## 2022-12-29 DIAGNOSIS — S0181XA Laceration without foreign body of other part of head, initial encounter: Secondary | ICD-10-CM | POA: Diagnosis not present

## 2022-12-29 DIAGNOSIS — W1839XA Other fall on same level, initial encounter: Secondary | ICD-10-CM | POA: Diagnosis not present

## 2022-12-29 DIAGNOSIS — S0990XA Unspecified injury of head, initial encounter: Secondary | ICD-10-CM | POA: Diagnosis not present

## 2022-12-29 DIAGNOSIS — M25552 Pain in left hip: Secondary | ICD-10-CM | POA: Diagnosis not present

## 2022-12-29 DIAGNOSIS — Z96642 Presence of left artificial hip joint: Secondary | ICD-10-CM | POA: Diagnosis not present

## 2022-12-29 DIAGNOSIS — R58 Hemorrhage, not elsewhere classified: Secondary | ICD-10-CM | POA: Diagnosis not present

## 2022-12-29 DIAGNOSIS — R6889 Other general symptoms and signs: Secondary | ICD-10-CM | POA: Diagnosis not present

## 2022-12-29 DIAGNOSIS — S199XXA Unspecified injury of neck, initial encounter: Secondary | ICD-10-CM | POA: Diagnosis not present

## 2022-12-29 DIAGNOSIS — I878 Other specified disorders of veins: Secondary | ICD-10-CM | POA: Diagnosis not present

## 2022-12-29 MED ORDER — LIDOCAINE-EPINEPHRINE-TETRACAINE (LET) TOPICAL GEL
3.0000 mL | Freq: Once | TOPICAL | Status: AC
  Administered 2022-12-29: 3 mL via TOPICAL
  Filled 2022-12-29: qty 3

## 2022-12-29 MED ORDER — BACITRACIN ZINC 500 UNIT/GM EX OINT
TOPICAL_OINTMENT | CUTANEOUS | Status: AC
  Administered 2022-12-29: 1
  Filled 2022-12-29: qty 0.9

## 2022-12-29 NOTE — ED Provider Notes (Signed)
[place] EMERGENCY DEPARTMENT AT [facility] Provider Note   CSN: [id number] Arrival date & time: 12/29/22  1306     History  No chief complaint on file.   [name] is a 89 y.o. female.  The history is provided by the patient and medical records. No language interpreter was used.  Fall This is a new problem. The current episode started less than 1 hour ago. The problem occurs rarely. The problem has not changed since onset.Associated symptoms include headaches. Pertinent negatives include no chest pain, no abdominal pain and no shortness of breath. Nothing aggravates the symptoms. Nothing relieves the symptoms. She has tried nothing for the symptoms. The treatment provided no relief.       Home Medications Prior to Admission medications   Medication Sig Start Date End Date Taking? Authorizing Provider  acetaminophen (TYLENOL) 325 MG tablet Take 325 mg by mouth every 6 (six) hours as needed.    [provider]  amLODipine (NORVASC) 2.5 MG tablet Take 1 tablet (2.5 mg total) by mouth daily. Please schedule office visit for futher refills 2nd attempt Patient taking differently: Take 5 mg by mouth daily. Pt taking 5mg  daily 12/29/21   Iran Ouch, MD  Cholecalciferol (VITAMIN D-3) 1000 UNITS CAPS Take 1 capsule by mouth daily.    [provider]  fluticasone (FLONASE) 50 MCG/ACT nasal spray Place 2 sprays into the nose daily as needed for allergies or rhinitis.     [provider]  gabapentin (NEURONTIN) 100 MG capsule Take 200 mg by mouth 3 (three) times daily as needed. 07/09/19   [provider]  loratadine (CLARITIN) 10 MG tablet Take 10 mg by mouth daily as needed for allergies.    [provider]  LORazepam (ATIVAN) 0.5 MG tablet Take 0.5 mg by mouth at bedtime.     [provider]  Multiple Vitamin (MULTIVITAMIN) tablet Take 1 tablet by mouth every other day.    [provider]   naphazoline-glycerin (CLEAR EYES) 0.012-0.2 % SOLN Place 1-2 drops into both eyes every 4 (four) hours as needed for irritation.    [provider]  olmesartan (BENICAR) 5 MG tablet Take 5 mg by mouth every morning.    [provider]  Probiotic Product (PROBIOTIC DAILY PO) Take by mouth daily.    [provider]  Psyllium 28.3 % POWD Take by mouth as needed.    [provider]  vitamin B-12 (CYANOCOBALAMIN) 1000 MCG tablet Take 1,000 mcg by mouth every other day.    [provider]      Allergies    Tramadol hcl, Tramadol hcl, Doxycycline, and Codeine    Review of Systems   Review of Systems  Constitutional:  Negative for chills, fatigue and fever.  HENT:  Negative for congestion.   Respiratory:  Negative for cough, chest tightness and shortness of breath.   Cardiovascular:  Negative for chest pain and palpitations.  Gastrointestinal:  Negative for abdominal pain, constipation, diarrhea, nausea and vomiting.  Genitourinary:  Negative for dysuria and flank pain.  Musculoskeletal:  Positive for neck pain. Negative for back pain and neck stiffness.  Skin:  Positive for wound. Negative for rash.  Neurological:  Positive for headaches. Negative for dizziness, weakness and light-headedness.  Psychiatric/Behavioral:  Negative for agitation and confusion.   All other systems reviewed and are negative.   Physical Exam Updated Vital Signs There were no vitals taken for this visit. Physical Exam Vitals and nursing  note reviewed.  Constitutional:      General: She is not in acute distress.    Appearance: She is well-developed.  HENT:     Head: Laceration present.      Comments: Pupils symmetric and reactive with normal extraocular movements.  Symmetric smile.  Clear speech.  Small left forehead laceration.    Nose: No congestion or rhinorrhea.     Mouth/Throat:     Mouth: Mucous membranes are moist.  Eyes:     Extraocular Movements:  Extraocular movements intact.     Conjunctiva/sclera: Conjunctivae normal.     Pupils: Pupils are equal, round, and reactive to light.  Cardiovascular:     Rate and Rhythm: Normal rate and regular rhythm.     Heart sounds: No murmur heard. Pulmonary:     Effort: Pulmonary effort is normal. No respiratory distress.     Breath sounds: Normal breath sounds. No wheezing, rhonchi or rales.  Chest:     Chest wall: No tenderness.  Abdominal:     General: Abdomen is flat.     Palpations: Abdomen is soft.     Tenderness: There is no abdominal tenderness. There is no guarding or rebound.  Musculoskeletal:        General: Tenderness present. No swelling.     Cervical back: Neck supple. No tenderness.     Comments: Mild soreness in the left hip area but not focally tender and had good range of motion.  Intact sensation, strength, and pulses distally.  Skin:    General: Skin is warm and dry.     Capillary Refill: Capillary refill takes less than 2 seconds.     Findings: No erythema.  Neurological:     Mental Status: She is alert.  Psychiatric:        Mood and Affect: Mood normal.     ED Results / Procedures / Treatments   Labs (all labs ordered are listed, but only abnormal results are displayed) Labs Reviewed - No data to display  EKG None  Radiology DG Hip Unilat With Pelvis 2-3 Views Left  Result Date: 12/29/2022 CLINICAL DATA:  Post fall, now with left hip pain. EXAM: DG HIP (WITH OR WITHOUT PELVIS) 2-3V LEFT COMPARISON:  None Available. FINDINGS: No fracture or dislocation. Post bipolar left total hip replacement without evidence of hardware failure or loosening. Limited visualization of the pelvis and contralateral right hip is normal. Phleboliths overlie the lower pelvis bilaterally. No radiopaque foreign body. IMPRESSION: 1. No acute findings. 2. Post bipolar left total hip replacement without evidence of hardware failure or loosening. Electronically Signed   By: Simonne Come M.D.    On: 12/29/2022 16:14   CT Head Wo Contrast  Result Date: 12/29/2022 CLINICAL DATA:  Neck trauma.  Status post fall. EXAM: CT HEAD WITHOUT CONTRAST CT CERVICAL SPINE WITHOUT CONTRAST TECHNIQUE: Multidetector CT imaging of the head and cervical spine was performed following the standard protocol without intravenous contrast. Multiplanar CT image reconstructions of the cervical spine were also generated. RADIATION DOSE REDUCTION: This exam was performed according to the departmental dose-optimization program which includes automated exposure control, adjustment of the mA and/or kV according to patient size and/or use of iterative reconstruction technique. COMPARISON:  None Available. FINDINGS: CT HEAD FINDINGS Brain: No evidence of acute infarction, hemorrhage, hydrocephalus, extra-axial collection or mass lesion/mass effect. Prominence of the sulci and ventricles compatible with brain atrophy. There is patchy low-attenuation within the subcortical and periventricular white matter compatible with chronic microvascular disease. Vascular:  No hyperdense vessel or unexpected calcification. Skull: Normal. Negative for fracture or focal lesion. Sinuses/Orbits: Paranasal sinuses and mastoid air cells are clear Other: None CT CERVICAL SPINE FINDINGS Alignment: Normal. Skull base and vertebrae: No acute fracture. No primary bone lesion or focal pathologic process. Soft tissues and spinal canal: No prevertebral fluid or swelling. No visible canal hematoma. Disc levels: Disc space narrowing and endplate spurring noted at C4-5, C5-6 and C6-7. Upper chest: Negative. Other: None IMPRESSION: 1. No acute intracranial abnormality. 2. Chronic small vessel ischemic disease and brain atrophy. 3. No evidence for cervical spine fracture or subluxation. 4. Cervical degenerative disc disease. Electronically Signed   By: Signa Kell M.D.   On: 12/29/2022 16:00   CT Cervical Spine Wo Contrast  Result Date: 12/29/2022 CLINICAL DATA:   Neck trauma.  Status post fall. EXAM: CT HEAD WITHOUT CONTRAST CT CERVICAL SPINE WITHOUT CONTRAST TECHNIQUE: Multidetector CT imaging of the head and cervical spine was performed following the standard protocol without intravenous contrast. Multiplanar CT image reconstructions of the cervical spine were also generated. RADIATION DOSE REDUCTION: This exam was performed according to the departmental dose-optimization program which includes automated exposure control, adjustment of the mA and/or kV according to patient size and/or use of iterative reconstruction technique. COMPARISON:  None Available. FINDINGS: CT HEAD FINDINGS Brain: No evidence of acute infarction, hemorrhage, hydrocephalus, extra-axial collection or mass lesion/mass effect. Prominence of the sulci and ventricles compatible with brain atrophy. There is patchy low-attenuation within the subcortical and periventricular white matter compatible with chronic microvascular disease. Vascular: No hyperdense vessel or unexpected calcification. Skull: Normal. Negative for fracture or focal lesion. Sinuses/Orbits: Paranasal sinuses and mastoid air cells are clear Other: None CT CERVICAL SPINE FINDINGS Alignment: Normal. Skull base and vertebrae: No acute fracture. No primary bone lesion or focal pathologic process. Soft tissues and spinal canal: No prevertebral fluid or swelling. No visible canal hematoma. Disc levels: Disc space narrowing and endplate spurring noted at C4-5, C5-6 and C6-7. Upper chest: Negative. Other: None IMPRESSION: 1. No acute intracranial abnormality. 2. Chronic small vessel ischemic disease and brain atrophy. 3. No evidence for cervical spine fracture or subluxation. 4. Cervical degenerative disc disease. Electronically Signed   By: Signa Kell M.D.   On: 12/29/2022 16:00    Procedures .Marland KitchenLaceration Repair  Date/Time: 12/29/2022 4:37 PM  Performed by: Heide Scales, MD Authorized by: Heide Scales, MD    Consent:    Consent obtained:  Verbal   Consent given by:  Patient   Risks, benefits, and alternatives were discussed: yes     Risks discussed:  Infection, pain and poor cosmetic result   Alternatives discussed:  No treatment Universal protocol:    Immediately prior to procedure, a time out was called: yes     Patient identity confirmed:  Verbally with patient Anesthesia:    Anesthesia method:  Topical application   Topical anesthetic:  LET Laceration details:    Length (cm):  1   Depth (mm):  2 Pre-procedure details:    Preparation:  Patient was prepped and draped in usual sterile fashion and imaging obtained to evaluate for foreign bodies Exploration:    Limited defect created (wound extended): no     Imaging outcome: foreign body not noted     Wound exploration: wound explored through full range of motion and entire depth of wound visualized     Contaminated: no   Treatment:    Area cleansed with:  Saline, Shur-Clens and chlorhexidine  Amount of cleaning:  Standard   Irrigation solution:  Sterile saline   Debridement:  None   Undermining:  None Skin repair:    Repair method:  Sutures   Suture size:  5-0   Suture material:  Prolene   Suture technique:  Simple interrupted   Number of sutures:  3 Approximation:    Approximation:  Close Repair type:    Repair type:  Simple Post-procedure details:    Dressing:  Antibiotic ointment and non-adherent dressing   Procedure completion:  Tolerated     Medications Ordered in ED Medications  bacitracin 500 UNIT/GM ointment (has no administration in time range)  lidocaine-EPINEPHrine-tetracaine (LET) topical gel (3 mLs Topical Given 12/29/22 1400)    ED Course/ Medical Decision Making/ A&P                                 Medical Decision Making Amount and/or Complexity of Data Reviewed Radiology: ordered.    [name] is a 89 y.o. female with a past medical history significant for GERD, COPD, hypertension,  and pulmonary fibrosis as well as previous renal cell mass status post right nephrectomy who presents with a mechanical fall.  According to patient, she was standing in her kitchen when her walker started slipping from her and she fell hitting her head.  She did not lose consciousness but has a laceration to her left forehead and has pain in her left hip.  She is denying any chest pain abdominal pain or back pain but does have some neck soreness.  She has a history of left hip partial replacement she reports.  Otherwise she had no preceding symptoms such as fevers, chills, palpitations, chest pain, shortness of breath, nausea, vomiting, constipation, diarrhea, or urinary changes.  She is otherwise feeling well.  On exam, she has a 1 similar laceration to her left forehead that is hemostatic.  Mild tenderness surrounding it but no significant crepitance.  Neck nontender but patient has some neck soreness.  Back chest and abdomen nontender.  Lungs clear.  Abdomen nontender.  Soreness in her left hip but she had good range of motion and had minimal tenderness initially.  Suspect soft tissue injury.  Will get x-ray of the left hip and pelvis and CT of the head and neck.  Will hold on more extensive lab and imaging workup and family agrees at this time.  Patient reports her tetanus is up-to-date, will use let and then repair the laceration.  CT imaging and x-ray returned read during.  No evidence of acute intracranial bleed or skull fracture.  No neck fracture and no hip fracture seen.  3 sutures placed without difficulty and wound was repaired.  Patient will follow-up with her PCP.  Patient agreed with plan of care and will have sutures removed as an outpatient.  She was discharged in good condition after wound repair.         Final Clinical Impression(s) / ED Diagnoses Final diagnoses:  Fall, initial encounter  Injury of head, initial encounter  Facial laceration, initial encounter    Rx / DC  Orders ED Discharge Orders     None       Clinical Impression: 1. Fall, initial encounter   2. Injury of head, initial encounter   3. Facial laceration, initial encounter     Disposition: Discharge  Condition: Good  I have discussed the results, Dx and Tx plan with  the pt(& family if present). He/she/they expressed understanding and agree(s) with the plan. Discharge instructions discussed at great length. Strict return precautions discussed and pt &/or family have verbalized understanding of the instructions. No further questions at time of discharge.    New Prescriptions   No medications on file    Follow Up: Camie Patience, FNP [address] [place] [address] [phone]        Eugine Bubb, Canary Brim, MD 12/29/22 [phone]

## 2022-12-29 NOTE — Discharge Instructions (Addendum)
Your history, exam, workup today did not show evidence of significant traumatic injuries but I do suspect you have some soft tissue pain from the fall.  Your CT imaging did not show acute bony injury or fractures and no intracranial bleeding.  Your laceration was repaired without difficulty after cleaning and is well-appearing.  You reported your tetanus was up-to-date.  Please follow-up with your primary doctor in 7 to 10 days for suture removal.  Please watch for signs and symptoms of infection that can happen despite good washing and cleaning.  If any symptoms recur, change, or worsen, return to the nearest emergency department.  Please rest and stay hydrated.

## 2022-12-29 NOTE — ED Triage Notes (Signed)
Pt BIBA from home for mechanical fall, was having lunch, turned, and lost balance. Uses walker, was using. 1/2cm round wound to left forehead, oozing. Denies LOC, no thinners. C/o left hip and leg pain. Partial replacement on that side. No shortening or rotation.   160/110 HR 80 96% ra

## 2023-01-10 DIAGNOSIS — S0083XD Contusion of other part of head, subsequent encounter: Secondary | ICD-10-CM | POA: Diagnosis not present

## 2023-01-10 DIAGNOSIS — G8929 Other chronic pain: Secondary | ICD-10-CM | POA: Diagnosis not present

## 2023-01-10 DIAGNOSIS — E871 Hypo-osmolality and hyponatremia: Secondary | ICD-10-CM | POA: Diagnosis not present

## 2023-01-10 DIAGNOSIS — G4700 Insomnia, unspecified: Secondary | ICD-10-CM | POA: Diagnosis not present

## 2023-01-10 DIAGNOSIS — M25552 Pain in left hip: Secondary | ICD-10-CM | POA: Diagnosis not present

## 2023-01-10 DIAGNOSIS — Z23 Encounter for immunization: Secondary | ICD-10-CM | POA: Diagnosis not present

## 2023-01-10 DIAGNOSIS — Z9989 Dependence on other enabling machines and devices: Secondary | ICD-10-CM | POA: Diagnosis not present

## 2023-01-10 DIAGNOSIS — M25551 Pain in right hip: Secondary | ICD-10-CM | POA: Diagnosis not present

## 2023-01-10 DIAGNOSIS — N1831 Chronic kidney disease, stage 3a: Secondary | ICD-10-CM | POA: Diagnosis not present

## 2023-01-17 DIAGNOSIS — E871 Hypo-osmolality and hyponatremia: Secondary | ICD-10-CM | POA: Diagnosis not present

## 2023-01-22 DIAGNOSIS — R296 Repeated falls: Secondary | ICD-10-CM | POA: Diagnosis not present

## 2023-01-22 DIAGNOSIS — R2689 Other abnormalities of gait and mobility: Secondary | ICD-10-CM | POA: Diagnosis not present

## 2023-01-24 DIAGNOSIS — R296 Repeated falls: Secondary | ICD-10-CM | POA: Diagnosis not present

## 2023-01-24 DIAGNOSIS — R2689 Other abnormalities of gait and mobility: Secondary | ICD-10-CM | POA: Diagnosis not present

## 2023-01-29 DIAGNOSIS — R296 Repeated falls: Secondary | ICD-10-CM | POA: Diagnosis not present

## 2023-01-29 DIAGNOSIS — R2689 Other abnormalities of gait and mobility: Secondary | ICD-10-CM | POA: Diagnosis not present

## 2023-01-31 DIAGNOSIS — R2689 Other abnormalities of gait and mobility: Secondary | ICD-10-CM | POA: Diagnosis not present

## 2023-01-31 DIAGNOSIS — R296 Repeated falls: Secondary | ICD-10-CM | POA: Diagnosis not present

## 2023-02-01 DIAGNOSIS — E871 Hypo-osmolality and hyponatremia: Secondary | ICD-10-CM | POA: Diagnosis not present

## 2023-02-05 DIAGNOSIS — R2689 Other abnormalities of gait and mobility: Secondary | ICD-10-CM | POA: Diagnosis not present

## 2023-02-05 DIAGNOSIS — R296 Repeated falls: Secondary | ICD-10-CM | POA: Diagnosis not present

## 2023-02-07 DIAGNOSIS — R2689 Other abnormalities of gait and mobility: Secondary | ICD-10-CM | POA: Diagnosis not present

## 2023-02-07 DIAGNOSIS — R296 Repeated falls: Secondary | ICD-10-CM | POA: Diagnosis not present

## 2023-02-08 ENCOUNTER — Encounter: Payer: Self-pay | Admitting: Internal Medicine

## 2023-02-08 ENCOUNTER — Ambulatory Visit: Payer: Medicare Other | Admitting: Internal Medicine

## 2023-02-08 VITALS — BP 120/78 | HR 78 | Ht 60.0 in | Wt 116.0 lb

## 2023-02-08 DIAGNOSIS — R053 Chronic cough: Secondary | ICD-10-CM

## 2023-02-08 DIAGNOSIS — R0982 Postnasal drip: Secondary | ICD-10-CM | POA: Diagnosis not present

## 2023-02-08 NOTE — Patient Instructions (Addendum)
ICD-10-CM   1. Cough  R05   2. Post-nasal drip  R09.82     -Your current cough is mild and probably mostly due to postnasal drip.  There is no evidence of COPD. Lung cancer or pneumonia or fibrosis on CT chest 2024    Plan -Continue nasal steroid   Follow-up -as needed

## 2023-02-08 NOTE — Progress Notes (Signed)
HPI: 89 yo female never smoker followed for mixed COPD/Asthma , chronic cough and allergic rhinitis  TEST  06/06/15 Alpha-1 antitrypsin: MM (132)  CT CHEST W/O 05/16/16 :  No parenchymal nodule, mass, or opacity appreciated. No pleural effusion or thickening. No pericardial effusion. No pathologic mediastinal adenopathy.   06/19/16: FVC 2.13 L (95%) FEV1 1.54 L (93%) FEV1/FVC 0.72 FEF 25-75 0.97 L (87%) no bronchodilator response 09/12/15: FVC 2.13 L (94%) FEV1 1.54 L (92%) FEV1/FVC 0.72 FEF 25-75 1.01 L (80%) no bronchodilator response TLC 4.18 L (100%) RV 116% ERV 169% DLCO corrected 79% (hemoglobin 12.3) 07/21/12: FVC 2.25 L (89%) FEV1 1.45 L (78%) FEV1/FVC 0.64 FEF 25-75 0.77 L (53%)  Previous evaluation at [facility] voice center.     04/18/2017 Follow up : COPD and AR  Pt returns for a follow up . She has Mild COPD and was previously on Spriiva but has been off for a few months. Says over last month breathing has not been doing as well and cough has picked up . She says she has a chronic cough which is worse for last few week. All the rainy weather seems to make it worse.  Says that spiriva has been the best help in the past , seems to help her cough and breathing .  She denies chest pain, orthopnea, edema or fever.  No discolored mucus.    OV 10/28/2017  Chief Complaint  Patient presents with   Follow-up    copd; nodules;chronic cough worse with humidity;no SOB     [name] , 89 y.o. , with dob 09/06/1930 and female ,Not Hispanic or Latino from [address] [place] [address] - presents to lung clinic for based on chart review for history of COPD and chronic cough.   In review of the chart she's had normal DLCO hardly ever smoke normal forward function test and CT scan of the chest in 2018 without any description of emphysema. Therefore I'm not so sure she has COPD. Nevertheless she has Spiriva with her when she only uses as needed. She also tells the  medical assistant that she's had lung nodules but in review of the CT chest 2018 there are no lung nodules. He tells me that the main issue is that she's had recurrent chronic bronchitis which for many years Dr. Delford Field took care of and resolved it.  Currently she says she feels pretty good. She's wondering about the need for regular follow-up. Her main issue that she is dealing with chronic back pain. Because she had a sharp level of COPD related to COPD with symptom score and is documented below but she says she is feeling really good and hardly having any symptoms. Walk test  - room air - 99% lowest 185 feet x 3 laps     OV 07/30/2019  Chief Complaint  Patient presents with   Follow-up    Pt last seen 10/28/17. Pt states she has been doing good since last visit and denies any complaints.     WHITLEY DALPIAZ , 89 y.o. , with dob [birth year] and female ,Not Hispanic or Latino from [address] [place] [address] - presents to lung clinic for based on chart review for history of COPD and chronic cough.   Patient is being treated for chronic bronchitis. She is out of her Spiriva and ask for refills. Denies any changes since her last office visit.  She has postnasal drip and is treating this  with over-the-counter medications. She ask about lung nodules on previous imaging and if she needs to have them followed up. I reviewed the imaging and previous findings. In 2008 lung nodules were noted and then followed up in 2011 CT chest.  At that time the nodule seen stable and further follow-up was not recommended.  Patient had imaging with CT Chest WO contrast in 2018 and the nodules were not seen.    OV 06/15/2022  Subjective:  Patient ID: [name], female , DOB: 1930-09-18 , age 72 y.o. , MRN: [id number] , ADDRESS: [address] [place] [address] PCP Camie Patience, FNP Patient Care Team: Camie Patience, FNP as PCP - General (Family Medicine) Storm Frisk, MD as Attending Physician (Pulmonary Disease)  This Provider for this visit: Treatment Team:  Attending Provider: Kalman Shan, MD    06/15/2022 -   Chief Complaint  Patient presents with   Follow-up    Cough f/up     HPI [name] 89 y.o. -returns for follow-up.  I personally not seen her in a few years.  She is going to be 89 years old  this EASTER She is now moved into a retirement community called [facility] by [employer] to have 1.    She says she has lost some height but is otherwise quite active.  Her mild chronic cough with postnasal drip and clearing of the throat continues.  She is not taking Spiriva inhaler.  Her last imaging of CT scan of the chest was in 2018.  Because of the ongoing chronic cough she is willing to get this reevaluated.  There are no other new issues.   OV 02/08/2023  Subjective:  Patient ID: [name], female , DOB: [birth year] , age 70 y.o. , MRN: [id number] , ADDRESS: [address] [place] [address] PCP Camie Patience, FNP Patient Care Team: Camie Patience, FNP as PCP - General (Family Medicine) Storm Frisk, MD as Attending Physician (Pulmonary Disease)  This Provider for this visit: Treatment Team:  Attending Provider: Kalman Shan, MD       02/08/2023 -   Chief Complaint  Patient presents with   Follow-up    Breathing is doing well. She only has occ cough. She does have some PND.      HPI DESTINAE Frost 89 y.o. -she is now 89 years old.  She is doing well.  She lives with his 76 year old husband who gets around with a cane.  She was driving to last month but had a fall but no fracture.  Since then she is using a walker.  Based on external medical record reviewed this was on 12/29/2022.  She had a CT head and CT spine and these are normal per chart review.  She is doing physical therapy and gaining strength back.  She is doing well cognitively as well.  She has only  very mild cough well-controlled with Flonase.  We did a CT scan of the chest in the spring 2024.  There is no ILD no cancer no nodule no fibrosis no emphysema.  There is patulous esophagus.  But nothing can be done about that.  I personally visualized the CT scan.      CAT CPD Symptom & Quality of Life Score (GSK trademark) 0 is no burden. 5 is highest burden 07/30/2019   Never Cough -> Cough all the time 2  No phlegm in chest -> Chest is  full of phlegm 1  No chest tightness -> Chest feels very tight 0  No dyspnea for 1 flight stairs/hill -> Very dyspneic for 1 flight of stairs 3  No limitations for ADL at home -> Very limited with ADL at home 1  Confident leaving home -> Not at all confident leaving home 0  Sleep soundly -> Do not sleep soundly because of lung condition 2  Lots of Energy -> No energy at all 2  TOTAL Score (max 40)  11     CT Chest data from date: HRCT 07/05/22 personally visualized the CT scan and agree with the opinion of Dr. Dorothey Baseman.  IMPRESSION: 1. No evidence of fibrotic interstitial lung disease. 2. Patulous esophagus, findings can be seen in the setting esophageal dysmotility. 3. Mild coronary artery calcifications. 4. Aortic Atherosclerosis (ICD10-I70.0).     Electronically Signed   By: Allegra Lai M.D.   On: 07/09/2022 11:31  PFT     Latest Ref Rng & Units 06/19/2016    3:06 PM 09/12/2015    8:54 AM  ILD indicators  FVC-Pre L 2.13  2.13   FVC-Predicted Pre % 95  94   FVC-Post L 2.09  2.07   FVC-Predicted Post % 94  91   TLC L  4.82   TLC Predicted %  100   DLCO uncorrected ml/min/mmHg  16.69   DLCO UNC %Pred %  76   DLCO Corrected ml/min/mmHg  17.31   DLCO COR %Pred %  79       LAB RESULTS last 96 hours No results found.  LAB RESULTS last 90 days No results found for this or any previous visit (from the past 2160 hour(s)).       has a past medical history of Arthritis, BOOP (bronchiolitis obliterans with organizing  pneumonia) (HCC), Bronchitis, Chronic kidney disease, Claustrophobia, Dysrhythmia, Hypertension, PONV (postoperative nausea and vomiting), and Pulmonary nodules.   reports that she has never smoked. She has been exposed to tobacco smoke. She has never used smokeless tobacco.  Past Surgical History:  Procedure Laterality Date   APPENDECTOMY     cataract surgery      bilateral    CHOLECYSTECTOMY     CYSTOSCOPY WITH RETROGRADE PYELOGRAM, URETEROSCOPY AND STENT PLACEMENT Right 08/20/2014   Procedure: CYSTOSCOPY WITH RIGHT RETROGRADE PYELOGRAM, RIGHT DIAGNOSTIC URETEROSCOPY, RIGHT URETERAL STENT PLACEMENT;  Surgeon: Sebastian Ache, MD;  Location: [facility];  Service: Urology;  Laterality: Right;   HIP SURGERY     partial replacement of left hip    meniscus tear surgery in right knee      ROBOT ASSISTED LAPAROSCOPIC NEPHRECTOMY Right 10/01/2014   Procedure: ROBOTIC ASSISTED LAPAROSCOPIC NEPHRECTOMY LYSIS OF ADHESIONS;  Surgeon: Sebastian Ache, MD;  Location: [facility];  Service: Urology;  Laterality: Right;   TONSILLECTOMY      Allergies  Allergen Reactions   Tramadol Hcl Nausea And Vomiting   Tramadol Hcl Nausea And Vomiting   Doxycycline Other (See Comments)    Other reaction(s): GI Upset (intolerance) Upsets stomach and makes feel uncomfortable Upsets stomach and makes feel uncomfortable   Codeine Anxiety    Other reaction(s): Other (See Comments) Hallucinations  Hallucinations     Immunization History  Administered Date(s) Administered   Influenza Split 12/25/2010, 12/04/2011, 01/02/2013, 12/24/2016   Influenza Whole 12/28/2005, 12/24/2008   Influenza, High Dose Seasonal PF 12/31/2016, 11/25/2018   Influenza,inj,Quad PF,6+ Mos 01/04/2014, 12/08/2014   Influenza-Unspecified 11/25/2015, 12/25/2022   Moderna Covid-19 Fall Seasonal Vaccine 63yrs & older  12/25/2022   Pneumococcal Conjugate-13 02/02/2013   Pneumococcal Polysaccharide-23 06/06/2015    Family History  Problem Relation Age  of Onset   Hypertension Mother    Cancer Mother    Heart failure Father    Emphysema Father    Prostate cancer Brother    CAD Brother        CABG     Current Outpatient Medications:    acetaminophen (TYLENOL) 325 MG tablet, Take 325 mg by mouth every 6 (six) hours as needed., Disp: , Rfl:    amLODipine (NORVASC) 5 MG tablet, Take 5 mg by mouth daily., Disp: , Rfl:    Cholecalciferol (VITAMIN D-3) 1000 UNITS CAPS, Take 1 capsule by mouth daily., Disp: , Rfl:    fluticasone (FLONASE) 50 MCG/ACT nasal spray, Place 2 sprays into the nose daily as needed for allergies or rhinitis. , Disp: , Rfl:    gabapentin (NEURONTIN) 300 MG capsule, Take 300 mg by mouth 3 (three) times daily., Disp: , Rfl:    loratadine (CLARITIN) 10 MG tablet, Take 10 mg by mouth daily as needed for allergies., Disp: , Rfl:    LORazepam (ATIVAN) 0.5 MG tablet, Take 0.5 mg by mouth at bedtime. , Disp: , Rfl:    Multiple Vitamin (MULTIVITAMIN) tablet, Take 1 tablet by mouth every other day., Disp: , Rfl:    naphazoline-glycerin (CLEAR EYES) 0.012-0.2 % SOLN, Place 1-2 drops into both eyes every 4 (four) hours as needed for irritation., Disp: , Rfl:    olmesartan (BENICAR) 5 MG tablet, Take 5 mg by mouth every morning., Disp: , Rfl:    Probiotic Product (PROBIOTIC DAILY PO), Take by mouth daily., Disp: , Rfl:    Psyllium 28.3 % POWD, Take by mouth as needed., Disp: , Rfl:    vitamin B-12 (CYANOCOBALAMIN) 1000 MCG tablet, Take 1,000 mcg by mouth every other day., Disp: , Rfl:       Objective:   Vitals:   02/08/23 1109  BP: 120/78  Pulse: 78  SpO2: 98%  Weight: 116 lb (52.6 kg)  Height: 5' (1.524 m)    Estimated body mass index is 22.65 kg/m as calculated from the following:   Height as of this encounter: 5' (1.524 m).   Weight as of this encounter: 116 lb (52.6 kg).  @WEIGHTCHANGE @  Filed Weights   02/08/23 1109  Weight: 116 lb (52.6 kg)     Physical Exam   General: No distress. Looks well.  Has a  walker O2 at rest: no Cane present: no Sitting in wheel chair: no Frail: yes Obese: no Neuro: Alert and Oriented x 3. GCS 15. Speech normal Psych: Pleasant Resp:  Barrel Chest - no.  Wheeze - no, Crackles - no, No overt respiratory distress CVS: Normal heart sounds. Murmurs - no Ext: Stigmata of Connective Tissue Disease - no HEENT: Normal upper airway. PEERL +. No post nasal drip        Assessment:       ICD-10-CM   1. Chronic cough  R05.3     2. Post-nasal drip  R09.82          Plan:     Patient Instructions     ICD-10-CM   1. Cough  R05   2. Post-nasal drip  R09.82     -Your current cough is mild and probably mostly due to postnasal drip.  There is no evidence of COPD. Lung cancer or pneumonia or fibrosis on CT chest 2024    Plan -Continue  nasal steroid   Follow-up -as needed   FOLLOWUP Return as needed.    SIGNATURE    Dr. Kalman Shan, M.D., F.C.C.P,  Pulmonary and Critical Care Medicine Staff Physician, [facility] Center Director - Interstitial Lung Disease  Program  Pulmonary Fibrosis [facility] at [facility] [place], [place], [address]  Pager: [phone], If no answer or between  15:00h - 7:00h: call [phone] Telephone: [phone]  11:51 AM 02/08/2023   Moderate Complexity MDM OFFICE  2021 E/M guidelines, first released in 2021, with minor revisions added in 2023 and 2024 Must meet the requirements for 2 out of 3 dimensions to qualify.    Number and complexity of problems addressed Amount and/or complexity of data reviewed Risk of complications and/or morbidity  One or more chronic illness with mild exacerbation, OR progression, OR  side effects of treatment  Two or more stable chronic illnesses  One undiagnosed new problem with uncertain prognosis  One acute illness with systemic symptoms   One Acute complicated injury Must meet the requirements for 1 of 3 of the  categories)  Category 1: Tests and documents, historian  Any combination of 3 of the following:  Assessment requiring an independent historian  Review of prior external note(s) from each unique source  Review of results of each unique test  Ordering of each unique test    Category 2: Interpretation of tests   Independent interpretation of a test performed by another physician/other qualified health care professional (not separately reported)  Category 3: Discuss management/tests  Discussion of management or test interpretation with external physician/other qualified health care professional/appropriate source (not separately reported) Moderate risk of morbidity from additional diagnostic testing or treatment Examples only:  Prescription drug management  Decision regarding minor surgery with identfied patient or procedure risk factors  Decision regarding elective major surgery without identified patient or procedure risk factors  Diagnosis or treatment significantly limited by social determinants of health

## 2023-02-12 DIAGNOSIS — R296 Repeated falls: Secondary | ICD-10-CM | POA: Diagnosis not present

## 2023-02-12 DIAGNOSIS — R2689 Other abnormalities of gait and mobility: Secondary | ICD-10-CM | POA: Diagnosis not present

## 2023-02-14 DIAGNOSIS — R2689 Other abnormalities of gait and mobility: Secondary | ICD-10-CM | POA: Diagnosis not present

## 2023-02-14 DIAGNOSIS — R296 Repeated falls: Secondary | ICD-10-CM | POA: Diagnosis not present

## 2023-02-19 DIAGNOSIS — R2689 Other abnormalities of gait and mobility: Secondary | ICD-10-CM | POA: Diagnosis not present

## 2023-02-19 DIAGNOSIS — R296 Repeated falls: Secondary | ICD-10-CM | POA: Diagnosis not present

## 2023-02-26 DIAGNOSIS — R2689 Other abnormalities of gait and mobility: Secondary | ICD-10-CM | POA: Diagnosis not present

## 2023-02-26 DIAGNOSIS — R296 Repeated falls: Secondary | ICD-10-CM | POA: Diagnosis not present

## 2023-02-28 DIAGNOSIS — R2689 Other abnormalities of gait and mobility: Secondary | ICD-10-CM | POA: Diagnosis not present

## 2023-02-28 DIAGNOSIS — R296 Repeated falls: Secondary | ICD-10-CM | POA: Diagnosis not present

## 2023-03-05 DIAGNOSIS — R2689 Other abnormalities of gait and mobility: Secondary | ICD-10-CM | POA: Diagnosis not present

## 2023-03-05 DIAGNOSIS — R296 Repeated falls: Secondary | ICD-10-CM | POA: Diagnosis not present

## 2023-03-07 DIAGNOSIS — R296 Repeated falls: Secondary | ICD-10-CM | POA: Diagnosis not present

## 2023-03-07 DIAGNOSIS — R2689 Other abnormalities of gait and mobility: Secondary | ICD-10-CM | POA: Diagnosis not present

## 2023-03-19 DIAGNOSIS — R296 Repeated falls: Secondary | ICD-10-CM | POA: Diagnosis not present

## 2023-03-19 DIAGNOSIS — R2689 Other abnormalities of gait and mobility: Secondary | ICD-10-CM | POA: Diagnosis not present

## 2023-03-28 DIAGNOSIS — R2689 Other abnormalities of gait and mobility: Secondary | ICD-10-CM | POA: Diagnosis not present

## 2023-03-28 DIAGNOSIS — R296 Repeated falls: Secondary | ICD-10-CM | POA: Diagnosis not present

## 2023-04-02 DIAGNOSIS — R296 Repeated falls: Secondary | ICD-10-CM | POA: Diagnosis not present

## 2023-04-02 DIAGNOSIS — R2689 Other abnormalities of gait and mobility: Secondary | ICD-10-CM | POA: Diagnosis not present

## 2023-04-04 DIAGNOSIS — R296 Repeated falls: Secondary | ICD-10-CM | POA: Diagnosis not present

## 2023-04-04 DIAGNOSIS — R2689 Other abnormalities of gait and mobility: Secondary | ICD-10-CM | POA: Diagnosis not present

## 2023-04-11 DIAGNOSIS — R296 Repeated falls: Secondary | ICD-10-CM | POA: Diagnosis not present

## 2023-04-11 DIAGNOSIS — R2689 Other abnormalities of gait and mobility: Secondary | ICD-10-CM | POA: Diagnosis not present

## 2023-04-16 DIAGNOSIS — R2689 Other abnormalities of gait and mobility: Secondary | ICD-10-CM | POA: Diagnosis not present

## 2023-04-16 DIAGNOSIS — R296 Repeated falls: Secondary | ICD-10-CM | POA: Diagnosis not present

## 2023-04-23 DIAGNOSIS — R296 Repeated falls: Secondary | ICD-10-CM | POA: Diagnosis not present

## 2023-04-23 DIAGNOSIS — R2689 Other abnormalities of gait and mobility: Secondary | ICD-10-CM | POA: Diagnosis not present

## 2023-04-30 DIAGNOSIS — R296 Repeated falls: Secondary | ICD-10-CM | POA: Diagnosis not present

## 2023-04-30 DIAGNOSIS — R2689 Other abnormalities of gait and mobility: Secondary | ICD-10-CM | POA: Diagnosis not present

## 2023-05-07 DIAGNOSIS — R296 Repeated falls: Secondary | ICD-10-CM | POA: Diagnosis not present

## 2023-05-07 DIAGNOSIS — R2689 Other abnormalities of gait and mobility: Secondary | ICD-10-CM | POA: Diagnosis not present

## 2023-05-14 DIAGNOSIS — R296 Repeated falls: Secondary | ICD-10-CM | POA: Diagnosis not present

## 2023-05-14 DIAGNOSIS — R2689 Other abnormalities of gait and mobility: Secondary | ICD-10-CM | POA: Diagnosis not present

## 2023-05-21 DIAGNOSIS — R2689 Other abnormalities of gait and mobility: Secondary | ICD-10-CM | POA: Diagnosis not present

## 2023-05-21 DIAGNOSIS — R296 Repeated falls: Secondary | ICD-10-CM | POA: Diagnosis not present

## 2023-05-28 DIAGNOSIS — R2689 Other abnormalities of gait and mobility: Secondary | ICD-10-CM | POA: Diagnosis not present

## 2023-05-28 DIAGNOSIS — R296 Repeated falls: Secondary | ICD-10-CM | POA: Diagnosis not present

## 2023-06-04 DIAGNOSIS — R2689 Other abnormalities of gait and mobility: Secondary | ICD-10-CM | POA: Diagnosis not present

## 2023-06-04 DIAGNOSIS — R296 Repeated falls: Secondary | ICD-10-CM | POA: Diagnosis not present

## 2023-06-18 DIAGNOSIS — R2689 Other abnormalities of gait and mobility: Secondary | ICD-10-CM | POA: Diagnosis not present

## 2023-06-18 DIAGNOSIS — R296 Repeated falls: Secondary | ICD-10-CM | POA: Diagnosis not present

## 2023-06-21 DIAGNOSIS — R2689 Other abnormalities of gait and mobility: Secondary | ICD-10-CM | POA: Diagnosis not present

## 2023-06-21 DIAGNOSIS — R296 Repeated falls: Secondary | ICD-10-CM | POA: Diagnosis not present

## 2023-06-25 DIAGNOSIS — R296 Repeated falls: Secondary | ICD-10-CM | POA: Diagnosis not present

## 2023-06-25 DIAGNOSIS — R2689 Other abnormalities of gait and mobility: Secondary | ICD-10-CM | POA: Diagnosis not present

## 2023-07-02 DIAGNOSIS — R2689 Other abnormalities of gait and mobility: Secondary | ICD-10-CM | POA: Diagnosis not present

## 2023-07-02 DIAGNOSIS — R296 Repeated falls: Secondary | ICD-10-CM | POA: Diagnosis not present

## 2023-07-09 DIAGNOSIS — R296 Repeated falls: Secondary | ICD-10-CM | POA: Diagnosis not present

## 2023-07-09 DIAGNOSIS — R2689 Other abnormalities of gait and mobility: Secondary | ICD-10-CM | POA: Diagnosis not present

## 2023-07-16 DIAGNOSIS — R296 Repeated falls: Secondary | ICD-10-CM | POA: Diagnosis not present

## 2023-07-16 DIAGNOSIS — R2689 Other abnormalities of gait and mobility: Secondary | ICD-10-CM | POA: Diagnosis not present

## 2023-07-18 DIAGNOSIS — I10 Essential (primary) hypertension: Secondary | ICD-10-CM | POA: Diagnosis not present

## 2023-07-18 DIAGNOSIS — E785 Hyperlipidemia, unspecified: Secondary | ICD-10-CM | POA: Diagnosis not present

## 2023-07-18 DIAGNOSIS — I2510 Atherosclerotic heart disease of native coronary artery without angina pectoris: Secondary | ICD-10-CM | POA: Diagnosis not present

## 2023-07-18 DIAGNOSIS — M19049 Primary osteoarthritis, unspecified hand: Secondary | ICD-10-CM | POA: Diagnosis not present

## 2023-07-18 DIAGNOSIS — I700 Atherosclerosis of aorta: Secondary | ICD-10-CM | POA: Diagnosis not present

## 2023-07-18 DIAGNOSIS — Q600 Renal agenesis, unilateral: Secondary | ICD-10-CM | POA: Diagnosis not present

## 2023-07-18 DIAGNOSIS — M810 Age-related osteoporosis without current pathological fracture: Secondary | ICD-10-CM | POA: Diagnosis not present

## 2023-07-18 DIAGNOSIS — G4700 Insomnia, unspecified: Secondary | ICD-10-CM | POA: Diagnosis not present

## 2023-07-18 DIAGNOSIS — Z0000 Encounter for general adult medical examination without abnormal findings: Secondary | ICD-10-CM | POA: Diagnosis not present

## 2023-07-18 DIAGNOSIS — N1831 Chronic kidney disease, stage 3a: Secondary | ICD-10-CM | POA: Diagnosis not present

## 2023-07-23 DIAGNOSIS — R296 Repeated falls: Secondary | ICD-10-CM | POA: Diagnosis not present

## 2023-07-23 DIAGNOSIS — R2689 Other abnormalities of gait and mobility: Secondary | ICD-10-CM | POA: Diagnosis not present

## 2023-07-30 DIAGNOSIS — R2689 Other abnormalities of gait and mobility: Secondary | ICD-10-CM | POA: Diagnosis not present

## 2023-07-30 DIAGNOSIS — R296 Repeated falls: Secondary | ICD-10-CM | POA: Diagnosis not present

## 2023-08-13 DIAGNOSIS — R2689 Other abnormalities of gait and mobility: Secondary | ICD-10-CM | POA: Diagnosis not present

## 2023-08-13 DIAGNOSIS — R296 Repeated falls: Secondary | ICD-10-CM | POA: Diagnosis not present

## 2023-08-19 NOTE — Progress Notes (Signed)
 Cardiology Office Note:    Date:  08/29/2023   ID:  JAMISON YUHASZ, DOB 04-18-1930, MRN [id number]  PCP:  Bufford Carne, FNP  Cardiologist:  Oneil Bigness, MD     Referring MD: Bufford Carne, FNP   Chief Complaint: routine follow-up of coronary artery calcifications   History of Present Illness:    [name] is a 89 y.o. female with a history of mild coronary artery calcifications noted on CT in 06/2022, COPD  bronchiolitis obliterans organizing pneumonia with pulmonary fibrosis, hypertension, s/p right nephrectomy with CKD stage IIIb who is followed by Dr. Rolm Clos and presents today for routine follow-up.   He was initially referred to Cardiology in 11/2016 at which time she reported atypical chest pain, shortness of breath, and fatigue. ETT and Echo were ordered for further evaluation. ETT was unremarkable. Echo showed LVEF of 55-60% with mild focal basal hypertrophy of the septum and grade 1 diastolic dysfunction as well as atrial septal aneurysm and mild TR.   Last Echo in 05/2020 showed LVEF of 55-60% with normal wall motoin and grade 1 diastolic dysfunction, normal RV function, and mild to moderate TR.  She was last seen by Dr. Rolm Clos in 07/2022 at which time she was doing well with no cardiac complaints.   Patient presents today for follow-up.  She has been doing very well since last office visit.  She is an active 89 year old who is still driving.  Her husband is 48 years old and is also doing well.  She states they are very blessed to have good health.  She denies any chest pain, shortness of breath, orthopnea, or PND.  She has some mild ankle edema (left chronically greater than right) but this is not new and is stable.  No other lower extremity edema.  She reports an occasional fluttering sensation if she coughs but no other palpitations.  No lightheadedness, dizziness, near-syncope/syncope.    EKGs/Labs/Other Studies Reviewed:    The following studies were  reviewed:  Exercise Tolerance Test 12/19/2016: Blood pressure demonstrated a hypertensive response to exercise. There was no ST segment deviation noted during stress.   Baseline ECG shows LVH especially with limb lead voltage In recovery there was mild ST segment flattening that was not  Thought to be significant HTN response to exercise _______________  Echocardiogram 05/30/2020: Impressions: 1. Left ventricular ejection fraction, by estimation, is 55 to 60%. The  left ventricle has normal function. The left ventricle has no regional  wall motion abnormalities. Left ventricular diastolic parameters are  consistent with Grade I diastolic  dysfunction (impaired relaxation).   2. Right ventricular systolic function is normal. The right ventricular  size is normal. There is normal pulmonary artery systolic pressure. The  estimated right ventricular systolic pressure is 34.6 mmHg.   3. The mitral valve is normal in structure. Trivial mitral valve  regurgitation. No evidence of mitral stenosis.   4. Tricuspid valve regurgitation is mild to moderate.   5. The aortic valve is tricuspid. Aortic valve regurgitation is not  visualized. Mild aortic valve sclerosis is present, with no evidence of  aortic valve stenosis.   6. The inferior vena cava is dilated in size with >50% respiratory  variability, suggesting right atrial pressure of 8 mmHg.    EKG:  EKG ordered today.   EKG Interpretation Date/Time:  Thursday August 29 2023 11:41:17 EDT Ventricular Rate:  77 PR Interval:  174 QRS Duration:  78 QT Interval:  362 QTC Calculation:  409 R Axis:   55  Text Interpretation: Normal sinus rhythm Possible Left atrial enlargement Possible Anterior infarct (cited on or before 19-Aug-2014) Non-specific T wave changes Confirmed by Soriyah Osberg [phone]) on 08/29/2023 11:47:47 AM    Recent Labs: No results found for requested labs within last 365 days.  Recent Lipid Panel No results found for:  "CHOL", "TRIG", "HDL", "CHOLHDL", "VLDL", "LDLCALC", "LDLDIRECT"  Physical Exam:    Vital Signs: BP (!) 148/96   Pulse 77   Ht 5\' 1"  (1.549 m)   Wt 115 lb 6.4 oz (52.3 kg)   SpO2 99%   BMI 21.80 kg/m     Wt Readings from Last 3 Encounters:  08/29/23 115 lb 6.4 oz (52.3 kg)  02/08/23 116 lb (52.6 kg)  08/22/22 113 lb 3.2 oz (51.3 kg)     General: 89 y.o. Caucasian female in no acute distress. HEENT: Normocephalic and atraumatic. Sclera clear.  Neck: Supple. No carotid bruits. No JVD. Heart: RRR. Distinct S1 and S2. No murmurs, gallops, or rubs.  Lungs: No increased work of breathing. Clear to ausculation bilaterally. No wheezes, rhonchi, or rales.  Extremities: No lower extremity edema.  Radial and distal pedal pulses 2+ and equal bilaterally. Skin: Warm and dry. Neuro: No focal deficits. Psych: Normal affect. Responds appropriately.   Assessment:    1. Coronary artery calcification   2. Primary hypertension   3. Stage 3b chronic kidney disease (HCC)   4. S/p nephrectomy     Plan:    Coronary Artery Calcifications Mild coronary artery calcifications were noted on chest CT in 06/2022. Prior ETT in 2018 was negative. - Doing very well. No chest pain or shortness of breath. - No specific medical therapy was recommended. She was not started on aspirin or statin as it was unclear how much benefit she would actually receive from them given advanced age and well controlled LDL (LDL was 73 in 06/2023).  Hypertension BP reasonable given advanced age. Initially 158/89 and then 148/96 on my personal recheck at the end of visit. She states BP is usually in the 130-140s/70s-80s at home.  - Continue current medications: Amlodipine  5mg  daily and Olmesartan 5mg  daily.  - Given advanced age, I would be okay with a BP of <150/100. Advised patient to let us  know if BP is consistently over this.  CKD Stage IIIb S/p Right Nephrectomy S/p right nephrectomy in 2016 for removal of a benign  tumor. Now with CKD stage IIIb. Last known creatinine was 1.2 in 2016.  - Labs followed closely by PCP at [facility].  Disposition: Follow up in 1 year.   Signed, [place] Dube E Sahaj Bona, PA-C  08/29/2023 12:24 PM    [facility]

## 2023-08-20 DIAGNOSIS — R296 Repeated falls: Secondary | ICD-10-CM | POA: Diagnosis not present

## 2023-08-20 DIAGNOSIS — R2689 Other abnormalities of gait and mobility: Secondary | ICD-10-CM | POA: Diagnosis not present

## 2023-08-27 DIAGNOSIS — R2689 Other abnormalities of gait and mobility: Secondary | ICD-10-CM | POA: Diagnosis not present

## 2023-08-27 DIAGNOSIS — R296 Repeated falls: Secondary | ICD-10-CM | POA: Diagnosis not present

## 2023-08-29 ENCOUNTER — Encounter: Payer: Self-pay | Admitting: Student

## 2023-08-29 ENCOUNTER — Ambulatory Visit: Attending: Student | Admitting: Student

## 2023-08-29 VITALS — BP 148/96 | HR 77 | Ht 61.0 in | Wt 115.4 lb

## 2023-08-29 DIAGNOSIS — N1832 Chronic kidney disease, stage 3b: Secondary | ICD-10-CM

## 2023-08-29 DIAGNOSIS — I2510 Atherosclerotic heart disease of native coronary artery without angina pectoris: Secondary | ICD-10-CM

## 2023-08-29 DIAGNOSIS — Z905 Acquired absence of kidney: Secondary | ICD-10-CM | POA: Diagnosis not present

## 2023-08-29 DIAGNOSIS — I10 Essential (primary) hypertension: Secondary | ICD-10-CM | POA: Diagnosis not present

## 2023-08-29 NOTE — Patient Instructions (Signed)
 Medication Instructions:  NO CHANGES *If you need a refill on your cardiac medications before your next appointment, please call your pharmacy*  Lab Work: NO LABS If you have labs (blood work) drawn today and your tests are completely normal, you will receive your results only by: MyChart Message (if you have MyChart) OR A paper copy in the mail If you have any lab test that is abnormal or we need to change your treatment, we will call you to review the results.  Testing/Procedures: NO TESTING  Follow-Up: At [facility], you and your health needs are our priority.  As part of our continuing mission to provide you with exceptional heart care, our providers are all part of one team.  This team includes your primary Cardiologist (physician) and Advanced Practice Providers or APPs (Physician Assistants and Nurse Practitioners) who all work together to provide you with the care you need, when you need it.  Your next appointment:   1 year(s)  Provider:   Oneil Bigness, MD or Sharren Decree, PA-C    Other Instructions MONITOR BLOOD PRESSURE IF IT IS PERSISTENTLY GREATER THAN 150/100, GIVE OFFICE A CALL

## 2023-09-02 DIAGNOSIS — H26492 Other secondary cataract, left eye: Secondary | ICD-10-CM | POA: Diagnosis not present

## 2023-09-05 DIAGNOSIS — R2689 Other abnormalities of gait and mobility: Secondary | ICD-10-CM | POA: Diagnosis not present

## 2023-09-05 DIAGNOSIS — R296 Repeated falls: Secondary | ICD-10-CM | POA: Diagnosis not present

## 2023-09-10 DIAGNOSIS — R296 Repeated falls: Secondary | ICD-10-CM | POA: Diagnosis not present

## 2023-09-10 DIAGNOSIS — R2689 Other abnormalities of gait and mobility: Secondary | ICD-10-CM | POA: Diagnosis not present

## 2023-09-17 DIAGNOSIS — R296 Repeated falls: Secondary | ICD-10-CM | POA: Diagnosis not present

## 2023-09-17 DIAGNOSIS — R2689 Other abnormalities of gait and mobility: Secondary | ICD-10-CM | POA: Diagnosis not present

## 2023-09-24 DIAGNOSIS — R2689 Other abnormalities of gait and mobility: Secondary | ICD-10-CM | POA: Diagnosis not present

## 2023-09-24 DIAGNOSIS — R296 Repeated falls: Secondary | ICD-10-CM | POA: Diagnosis not present

## 2023-10-01 DIAGNOSIS — R2689 Other abnormalities of gait and mobility: Secondary | ICD-10-CM | POA: Diagnosis not present

## 2023-10-01 DIAGNOSIS — R296 Repeated falls: Secondary | ICD-10-CM | POA: Diagnosis not present

## 2023-10-08 DIAGNOSIS — R296 Repeated falls: Secondary | ICD-10-CM | POA: Diagnosis not present

## 2023-10-08 DIAGNOSIS — R2689 Other abnormalities of gait and mobility: Secondary | ICD-10-CM | POA: Diagnosis not present

## 2023-10-22 DIAGNOSIS — R2689 Other abnormalities of gait and mobility: Secondary | ICD-10-CM | POA: Diagnosis not present

## 2023-10-22 DIAGNOSIS — R296 Repeated falls: Secondary | ICD-10-CM | POA: Diagnosis not present

## 2023-11-14 DIAGNOSIS — R2689 Other abnormalities of gait and mobility: Secondary | ICD-10-CM | POA: Diagnosis not present

## 2023-11-14 DIAGNOSIS — R296 Repeated falls: Secondary | ICD-10-CM | POA: Diagnosis not present

## 2023-11-19 DIAGNOSIS — R2689 Other abnormalities of gait and mobility: Secondary | ICD-10-CM | POA: Diagnosis not present

## 2023-11-19 DIAGNOSIS — R296 Repeated falls: Secondary | ICD-10-CM | POA: Diagnosis not present

## 2023-11-21 DIAGNOSIS — R296 Repeated falls: Secondary | ICD-10-CM | POA: Diagnosis not present

## 2023-11-21 DIAGNOSIS — R2689 Other abnormalities of gait and mobility: Secondary | ICD-10-CM | POA: Diagnosis not present

## 2023-11-28 DIAGNOSIS — R2689 Other abnormalities of gait and mobility: Secondary | ICD-10-CM | POA: Diagnosis not present

## 2023-11-28 DIAGNOSIS — R296 Repeated falls: Secondary | ICD-10-CM | POA: Diagnosis not present

## 2023-12-03 DIAGNOSIS — R2689 Other abnormalities of gait and mobility: Secondary | ICD-10-CM | POA: Diagnosis not present

## 2023-12-03 DIAGNOSIS — R296 Repeated falls: Secondary | ICD-10-CM | POA: Diagnosis not present

## 2023-12-05 DIAGNOSIS — R2689 Other abnormalities of gait and mobility: Secondary | ICD-10-CM | POA: Diagnosis not present

## 2023-12-05 DIAGNOSIS — R296 Repeated falls: Secondary | ICD-10-CM | POA: Diagnosis not present

## 2023-12-10 DIAGNOSIS — R296 Repeated falls: Secondary | ICD-10-CM | POA: Diagnosis not present

## 2023-12-10 DIAGNOSIS — R2689 Other abnormalities of gait and mobility: Secondary | ICD-10-CM | POA: Diagnosis not present

## 2023-12-12 DIAGNOSIS — R296 Repeated falls: Secondary | ICD-10-CM | POA: Diagnosis not present

## 2023-12-12 DIAGNOSIS — R2689 Other abnormalities of gait and mobility: Secondary | ICD-10-CM | POA: Diagnosis not present

## 2023-12-17 DIAGNOSIS — R2689 Other abnormalities of gait and mobility: Secondary | ICD-10-CM | POA: Diagnosis not present

## 2023-12-17 DIAGNOSIS — R296 Repeated falls: Secondary | ICD-10-CM | POA: Diagnosis not present

## 2023-12-19 DIAGNOSIS — R2689 Other abnormalities of gait and mobility: Secondary | ICD-10-CM | POA: Diagnosis not present

## 2023-12-19 DIAGNOSIS — R296 Repeated falls: Secondary | ICD-10-CM | POA: Diagnosis not present

## 2023-12-24 DIAGNOSIS — R2689 Other abnormalities of gait and mobility: Secondary | ICD-10-CM | POA: Diagnosis not present

## 2023-12-24 DIAGNOSIS — R296 Repeated falls: Secondary | ICD-10-CM | POA: Diagnosis not present
